# Patient Record
Sex: Female | Born: 1950 | Race: White | Hispanic: No | Marital: Married | Smoking: Never smoker
Health system: Southern US, Community
[De-identification: ages and names within clinical notes are randomized; demographics above are authoritative.]

## PROBLEM LIST (undated history)

## (undated) DIAGNOSIS — Z973 Presence of spectacles and contact lenses: Secondary | ICD-10-CM

## (undated) DIAGNOSIS — R51 Headache: Secondary | ICD-10-CM

## (undated) DIAGNOSIS — R112 Nausea with vomiting, unspecified: Secondary | ICD-10-CM

## (undated) DIAGNOSIS — Z9889 Other specified postprocedural states: Secondary | ICD-10-CM

## (undated) DIAGNOSIS — K5790 Diverticulosis of intestine, part unspecified, without perforation or abscess without bleeding: Secondary | ICD-10-CM

## (undated) DIAGNOSIS — M1711 Unilateral primary osteoarthritis, right knee: Secondary | ICD-10-CM

## (undated) DIAGNOSIS — M1990 Unspecified osteoarthritis, unspecified site: Secondary | ICD-10-CM

## (undated) DIAGNOSIS — K219 Gastro-esophageal reflux disease without esophagitis: Secondary | ICD-10-CM

## (undated) DIAGNOSIS — M1710 Unilateral primary osteoarthritis, unspecified knee: Secondary | ICD-10-CM

## (undated) DIAGNOSIS — R519 Headache, unspecified: Secondary | ICD-10-CM

## (undated) HISTORY — DX: Unspecified osteoarthritis, unspecified site: M19.90

## (undated) HISTORY — PX: COLONOSCOPY: SHX174

## (undated) HISTORY — PX: KNEE SURGERY: SHX244

## (undated) HISTORY — PX: TUBAL LIGATION: SHX77

---

## 1998-10-04 ENCOUNTER — Other Ambulatory Visit: Admission: RE | Admit: 1998-10-04 | Discharge: 1998-10-04 | Payer: Self-pay | Admitting: Obstetrics and Gynecology

## 2001-03-25 ENCOUNTER — Other Ambulatory Visit: Admission: RE | Admit: 2001-03-25 | Discharge: 2001-03-25 | Payer: Self-pay | Admitting: Obstetrics and Gynecology

## 2002-07-09 ENCOUNTER — Other Ambulatory Visit: Admission: RE | Admit: 2002-07-09 | Discharge: 2002-07-09 | Payer: Self-pay | Admitting: Obstetrics and Gynecology

## 2003-08-27 ENCOUNTER — Other Ambulatory Visit: Admission: RE | Admit: 2003-08-27 | Discharge: 2003-08-27 | Payer: Self-pay | Admitting: Obstetrics and Gynecology

## 2004-05-26 ENCOUNTER — Ambulatory Visit: Payer: Self-pay | Admitting: Internal Medicine

## 2004-07-01 ENCOUNTER — Ambulatory Visit: Payer: Self-pay | Admitting: Family Medicine

## 2004-08-30 ENCOUNTER — Ambulatory Visit (HOSPITAL_COMMUNITY): Admission: RE | Admit: 2004-08-30 | Discharge: 2004-08-30 | Payer: Self-pay | Admitting: Obstetrics and Gynecology

## 2004-08-30 ENCOUNTER — Other Ambulatory Visit: Admission: RE | Admit: 2004-08-30 | Discharge: 2004-08-30 | Payer: Self-pay | Admitting: Obstetrics and Gynecology

## 2006-01-05 ENCOUNTER — Other Ambulatory Visit: Admission: RE | Admit: 2006-01-05 | Discharge: 2006-01-05 | Payer: Self-pay | Admitting: Obstetrics and Gynecology

## 2008-02-26 ENCOUNTER — Encounter: Payer: Self-pay | Admitting: Obstetrics and Gynecology

## 2008-02-26 ENCOUNTER — Ambulatory Visit: Payer: Self-pay | Admitting: Obstetrics and Gynecology

## 2008-02-26 ENCOUNTER — Other Ambulatory Visit: Admission: RE | Admit: 2008-02-26 | Discharge: 2008-02-26 | Payer: Self-pay | Admitting: Obstetrics and Gynecology

## 2008-04-20 ENCOUNTER — Encounter: Payer: Self-pay | Admitting: Internal Medicine

## 2008-04-22 ENCOUNTER — Encounter: Payer: Self-pay | Admitting: Internal Medicine

## 2008-09-28 ENCOUNTER — Ambulatory Visit: Payer: Self-pay | Admitting: Obstetrics and Gynecology

## 2008-10-06 ENCOUNTER — Ambulatory Visit: Payer: Self-pay | Admitting: Obstetrics and Gynecology

## 2009-04-14 ENCOUNTER — Ambulatory Visit: Payer: Self-pay | Admitting: Obstetrics and Gynecology

## 2009-04-22 ENCOUNTER — Ambulatory Visit: Payer: Self-pay | Admitting: Obstetrics and Gynecology

## 2009-05-26 ENCOUNTER — Ambulatory Visit: Payer: Self-pay | Admitting: Obstetrics and Gynecology

## 2010-02-04 ENCOUNTER — Ambulatory Visit: Payer: Self-pay | Admitting: Gynecology

## 2010-02-22 ENCOUNTER — Other Ambulatory Visit: Admission: RE | Admit: 2010-02-22 | Discharge: 2010-02-22 | Payer: Self-pay | Admitting: Addiction Medicine

## 2010-02-22 ENCOUNTER — Ambulatory Visit: Payer: Self-pay | Admitting: Obstetrics and Gynecology

## 2011-02-27 ENCOUNTER — Encounter: Payer: Self-pay | Admitting: Obstetrics and Gynecology

## 2011-02-27 ENCOUNTER — Ambulatory Visit (INDEPENDENT_AMBULATORY_CARE_PROVIDER_SITE_OTHER): Payer: Managed Care, Other (non HMO) | Admitting: Obstetrics and Gynecology

## 2011-02-27 ENCOUNTER — Other Ambulatory Visit (HOSPITAL_COMMUNITY)
Admission: RE | Admit: 2011-02-27 | Discharge: 2011-02-27 | Disposition: A | Discharge status: Home / Self Care | Payer: Managed Care, Other (non HMO) | Source: Ambulatory Visit | Attending: Obstetrics and Gynecology | Admitting: Obstetrics and Gynecology

## 2011-02-27 VITALS — BP 124/80 | Ht 64.0 in | Wt 196.0 lb

## 2011-02-27 DIAGNOSIS — E782 Mixed hyperlipidemia: Secondary | ICD-10-CM

## 2011-02-27 DIAGNOSIS — Z01419 Encounter for gynecological examination (general) (routine) without abnormal findings: Secondary | ICD-10-CM

## 2011-02-27 DIAGNOSIS — Z8262 Family history of osteoporosis: Secondary | ICD-10-CM

## 2011-02-27 DIAGNOSIS — R82998 Other abnormal findings in urine: Secondary | ICD-10-CM

## 2011-02-27 MED ORDER — NITROFURANTOIN MONOHYD MACRO 100 MG PO CAPS: 100.0000 mg | ORAL_CAPSULE | Freq: Two times a day (BID) | ORAL | Status: AC

## 2011-02-27 NOTE — Progress Notes (Signed)
The patient came to see me today for an annual GYN exam. She is having urgency and frequency for one week. She is overdue for mammogram. She's never had a bone density and she has a family history. She is having no vaginal bleeding. She is having some hot flashes and vaginal dryness. It is not enough to take medication. She had an elevated triglyceride level last year.  Physical examination: HEENT within normal limits. Neck: Thyroid not large. No masses. Supraclavicular nodes: not enlarged. Breasts: Examined in both sitting midline position. No skin changes and no masses. Abdomen: Soft no guarding rebound or masses or hernia. Pelvic: External: Within normal limits. BUS: Within normal limits. Vaginal:within normal limits. Good estrogen effect. No evidence of cystocele rectocele or enterocele. Cervix: clean. Uterus: Normal size and shape. Adnexa: No masses. Rectovaginal exam: Confirmatory and negative. Extremities: Within normal limits. Urinalysis-abnormal  Assessment: 1. UTI  2. Mild menopausal symptoms  Plan: Urine culture done. Macrobid twice a day with food for 7 days. Bone density schedule. Patient to schedule mammogram. Patient to return fasting for her lab work.

## 2011-02-27 NOTE — Patient Instructions (Signed)
Return fasting for lab work.

## 2011-02-28 ENCOUNTER — Telehealth: Payer: Self-pay | Admitting: *Deleted

## 2011-02-28 DIAGNOSIS — N390 Urinary tract infection, site not specified: Secondary | ICD-10-CM

## 2011-02-28 NOTE — Telephone Encounter (Signed)
Patient informed needs toc urine culture after antibiotics.

## 2011-03-08 ENCOUNTER — Other Ambulatory Visit: Payer: Managed Care, Other (non HMO)

## 2011-05-01 ENCOUNTER — Ambulatory Visit (INDEPENDENT_AMBULATORY_CARE_PROVIDER_SITE_OTHER): Payer: Managed Care, Other (non HMO) | Admitting: Obstetrics and Gynecology

## 2011-05-01 DIAGNOSIS — N390 Urinary tract infection, site not specified: Secondary | ICD-10-CM

## 2011-05-01 DIAGNOSIS — R300 Dysuria: Secondary | ICD-10-CM

## 2011-05-01 DIAGNOSIS — R82998 Other abnormal findings in urine: Secondary | ICD-10-CM

## 2011-05-01 MED ORDER — NITROFURANTOIN MONOHYD MACRO 100 MG PO CAPS: 100.0000 mg | ORAL_CAPSULE | Freq: Two times a day (BID) | ORAL | Status: AC

## 2011-05-01 NOTE — Progress Notes (Signed)
Patient came in today with a five-day history of dysuria, urgency, and administering of urination. She is not having urinary incontinence. She does not have hematuria. Her urinalysis showed too numerous to count white blood cells and red blood cells. She has a history of allergy to Cipro.  Assessment: UTI  Plan: Macrobid twice a day with food for 7 days. Forced fluids. Urine recheck in one week.

## 2011-05-01 NOTE — Progress Notes (Signed)
Addended by: Landis Martins R on: 05/01/2011 11:05 AM   Modules accepted: Orders

## 2011-05-09 ENCOUNTER — Ambulatory Visit (INDEPENDENT_AMBULATORY_CARE_PROVIDER_SITE_OTHER): Payer: Managed Care, Other (non HMO) | Admitting: Obstetrics and Gynecology

## 2011-05-09 DIAGNOSIS — R3915 Urgency of urination: Secondary | ICD-10-CM

## 2011-05-09 DIAGNOSIS — R350 Frequency of micturition: Secondary | ICD-10-CM

## 2011-05-09 MED ORDER — SULFAMETHOXAZOLE-TRIMETHOPRIM 400-80 MG PO TABS: 1.0000 | ORAL_TABLET | Freq: Two times a day (BID) | ORAL | Status: AC

## 2011-05-09 NOTE — Progress Notes (Signed)
Addended by: Landis Martins R on: 05/09/2011 02:37 PM   Modules accepted: Orders

## 2011-05-09 NOTE — Progress Notes (Signed)
Patient came back to see me today for followup of urinary tract infection. Her culture grew out greater than 100,000 colonies pure. Her urgency and frequency are much better but she still has some of these symptoms. She thinks she's about 70% better. Her urinalysis still shows 4-6 white blood cells per high-power field. The hematuria is much improved.  Assessment: Persistent urinary tract infection  Plan: Culture and sensitivity done. Until we have the results I treated  her with Septra DS twice a day for 5 days. I thought about using Cipro. When she used the pill in the past she did get some pain near her ankles and I was concerned about tendinitis from the Cipro.

## 2011-05-11 LAB — URINE CULTURE: Colony Count: 2000

## 2011-05-12 ENCOUNTER — Telehealth: Payer: Self-pay | Admitting: *Deleted

## 2011-05-12 NOTE — Telephone Encounter (Signed)
Message copied by Libby Maw on Fri May 12, 2011  2:42 PM ------      Message from: Trellis Paganini      Created: Thu May 11, 2011  5:56 PM       Tell pt her culture did not show infection but since she is symptomatic feel she should continue septra that I gave her. Have her call back when she is done with the five days I gave her yesterday and report how she is doing.

## 2011-05-12 NOTE — Telephone Encounter (Signed)
Patient states that she still has the urgency so will continue antibiotics.  Says she thinks she may have bladder leakage, and with the urgency maybe that is why she thinks she has a UTI.  Is there something we can give her in the mean time while DG is out to help with the sensation she is having?

## 2011-05-12 NOTE — Telephone Encounter (Signed)
Lm for patient to call

## 2011-05-12 NOTE — Telephone Encounter (Signed)
Please call patient and have her try Uristat, and finish  antibiotic Rx.

## 2011-05-12 NOTE — Telephone Encounter (Signed)
Patient informed. 

## 2011-07-08 ENCOUNTER — Other Ambulatory Visit: Payer: Self-pay | Admitting: Obstetrics and Gynecology

## 2011-11-07 ENCOUNTER — Encounter: Payer: Self-pay | Admitting: Obstetrics and Gynecology

## 2012-02-28 ENCOUNTER — Ambulatory Visit (INDEPENDENT_AMBULATORY_CARE_PROVIDER_SITE_OTHER): Payer: Managed Care, Other (non HMO) | Admitting: Obstetrics and Gynecology

## 2012-02-28 ENCOUNTER — Encounter: Payer: Self-pay | Admitting: Obstetrics and Gynecology

## 2012-02-28 ENCOUNTER — Encounter: Payer: Managed Care, Other (non HMO) | Admitting: Obstetrics and Gynecology

## 2012-02-28 VITALS — BP 136/84 | Ht 64.0 in | Wt 190.0 lb

## 2012-02-28 DIAGNOSIS — Z01419 Encounter for gynecological examination (general) (routine) without abnormal findings: Secondary | ICD-10-CM

## 2012-02-28 DIAGNOSIS — M1990 Unspecified osteoarthritis, unspecified site: Secondary | ICD-10-CM | POA: Insufficient documentation

## 2012-02-28 LAB — LIPID PANEL
Cholesterol: 205 mg/dL — ABNORMAL HIGH (ref 0–200)
LDL Cholesterol: 107 mg/dL — ABNORMAL HIGH (ref 0–99)
Triglycerides: 90 mg/dL (ref <150)
VLDL: 18 mg/dL (ref 0–40)

## 2012-02-28 LAB — HEMOGLOBIN A1C: Mean Plasma Glucose: 117 mg/dL — ABNORMAL HIGH (ref <117)

## 2012-02-28 NOTE — Progress Notes (Signed)
Patient came to see me today for her annual GYN exam. She is doing well in menopause without hot flashes. She is up-to-date on mammograms. We asked her to do a bone density last year but so far she's not done it. She requested we do her labs but she had a CBC and EKG elsewhere. She is having no vaginal bleeding. She is having no pelvic pain. She has never had an abnormal Pap smear. Her last Pap smear was 2012.  Physical examination:Mary Daniel present. HEENT within normal limits. Neck: Thyroid not large. No masses. Supraclavicular nodes: not enlarged. Breasts: Examined in both sitting and lying  position. No skin changes and no masses. Abdomen: Soft no guarding rebound or masses or hernia. Pelvic: External: Within normal limits. BUS: Within normal limits. Vaginal:within normal limits. Good estrogen effect. No evidence of cystocele rectocele or enterocele. Cervix: clean. Uterus: Normal size and shape. Adnexa: No masses. Rectovaginal exam: Confirmatory and negative. Extremities: Within normal limits.  Assessment: Normal GYN exam  Plan: Continue yearly mammograms. Schedule  bone density. The new Pap smear guidelines were discussed with the patient. No pap done.

## 2012-02-28 NOTE — Patient Instructions (Signed)
Schedule bone density.    

## 2012-02-29 ENCOUNTER — Encounter: Payer: Self-pay | Admitting: Obstetrics and Gynecology

## 2012-02-29 LAB — URINALYSIS W MICROSCOPIC + REFLEX CULTURE
Casts: NONE SEEN
Glucose, UA: NEGATIVE mg/dL
Nitrite: NEGATIVE
Protein, ur: NEGATIVE mg/dL
Specific Gravity, Urine: 1.014 (ref 1.005–1.030)
Urobilinogen, UA: 0.2 mg/dL (ref 0.0–1.0)
pH: 6 (ref 5.0–8.0)

## 2012-03-04 ENCOUNTER — Other Ambulatory Visit: Payer: Self-pay | Admitting: Obstetrics and Gynecology

## 2012-03-04 DIAGNOSIS — N390 Urinary tract infection, site not specified: Secondary | ICD-10-CM

## 2012-03-04 MED ORDER — SULFAMETHOXAZOLE-TRIMETHOPRIM 800-160 MG PO TABS: 1.0000 | ORAL_TABLET | Freq: Two times a day (BID) | ORAL | Status: DC

## 2012-06-07 ENCOUNTER — Ambulatory Visit (INDEPENDENT_AMBULATORY_CARE_PROVIDER_SITE_OTHER): Payer: Managed Care, Other (non HMO) | Admitting: Gynecology

## 2012-06-07 ENCOUNTER — Encounter: Payer: Self-pay | Admitting: Gynecology

## 2012-06-07 ENCOUNTER — Other Ambulatory Visit: Payer: Self-pay | Admitting: Gynecology

## 2012-06-07 VITALS — Temp 97.0°F

## 2012-06-07 DIAGNOSIS — N3090 Cystitis, unspecified without hematuria: Secondary | ICD-10-CM

## 2012-06-07 DIAGNOSIS — M549 Dorsalgia, unspecified: Secondary | ICD-10-CM

## 2012-06-07 LAB — URINALYSIS W MICROSCOPIC + REFLEX CULTURE
Bacteria, UA: NONE SEEN
Casts: NONE SEEN
Hgb urine dipstick: NEGATIVE
Nitrite: POSITIVE — AB
Specific Gravity, Urine: >1.03 — ABNORMAL HIGH (ref 1.005–1.030)

## 2012-06-07 MED ORDER — SULFAMETHOXAZOLE-TRIMETHOPRIM 800-160 MG PO TABS: 1.0000 | ORAL_TABLET | Freq: Two times a day (BID) | ORAL | Status: DC

## 2012-06-07 MED ORDER — IBUPROFEN 800 MG PO TABS: 800.0000 mg | ORAL_TABLET | Freq: Three times a day (TID) | ORAL | Status: DC | PRN

## 2012-06-07 NOTE — Patient Instructions (Signed)
Take septra twice daily for 3 days.  Use motrin 800 mg every 8 hours for pain

## 2012-06-07 NOTE — Progress Notes (Signed)
Patient presents with 2 days of low back pain mild urgency symptoms. No frequency or dysuria. Has a history of UTIs in the past. Does feel little chills but no fevers. Took over-the-counter AZ0 standard yesterday.  Exam was chem assistant Afebrile Spine straight no CVA tenderness Abdomen soft nontender without masses guarding rebound organomegaly Pelvic bimanual without masses or tenderness  Assessment and plan: Symptoms suggestive of early UTI. Urinalysis does show positive nitrate and several WBCs per high-powered field. Will cover with Septra DS one by mouth twice a day x3 days. Also provided Motrin 800 mg #30 refill x1 for her low back pain which I'm not sure is related to her urinary system. She'll follow up if either of her symptoms persist. Otherwise follow up which is due for her annual exam.

## 2012-07-05 ENCOUNTER — Telehealth: Payer: Self-pay | Admitting: *Deleted

## 2012-07-05 MED ORDER — NITROFURANTOIN MONOHYD MACRO 100 MG PO CAPS: 100.0000 mg | ORAL_CAPSULE | Freq: Two times a day (BID) | ORAL | Status: DC

## 2012-07-05 NOTE — Telephone Encounter (Signed)
Macrobid 100 mg twice a day x7 days, office visit if symptoms persist

## 2012-07-05 NOTE — Telephone Encounter (Signed)
Pt was given Septra DS one by mouth twice a day x3 days for UTI on 06/07/12, she took rx and noticed that symptoms have returned in last couple of days. Pt is having back discomfort she uses the motrin for this, urgency. No chills,no burning,no fever or any other symptoms. Please advise

## 2012-07-05 NOTE — Telephone Encounter (Signed)
Pt informed with the below note. 

## 2012-07-17 ENCOUNTER — Encounter: Payer: Self-pay | Admitting: Gynecology

## 2012-07-17 ENCOUNTER — Ambulatory Visit (INDEPENDENT_AMBULATORY_CARE_PROVIDER_SITE_OTHER): Payer: Managed Care, Other (non HMO) | Admitting: Gynecology

## 2012-07-17 DIAGNOSIS — N390 Urinary tract infection, site not specified: Secondary | ICD-10-CM

## 2012-07-17 DIAGNOSIS — R3915 Urgency of urination: Secondary | ICD-10-CM

## 2012-07-17 LAB — URINALYSIS W MICROSCOPIC + REFLEX CULTURE
Bilirubin Urine: NEGATIVE
Casts: NONE SEEN
Crystals: NONE SEEN
Ketones, ur: NEGATIVE mg/dL
Specific Gravity, Urine: 1.025 (ref 1.005–1.030)
Urobilinogen, UA: 0.2 mg/dL (ref 0.0–1.0)
pH: 7 (ref 5.0–8.0)

## 2012-07-17 MED ORDER — CIPROFLOXACIN HCL 250 MG PO TABS: 250.0000 mg | ORAL_TABLET | Freq: Two times a day (BID) | ORAL | Status: DC

## 2012-07-17 NOTE — Progress Notes (Signed)
Patient presents having recently been treated for UTI with Septra DS twice a day x3 days 06/07/2012. Her symptoms got better but now have returned with suprapubic pressure and some low back pain.  Exam Spine straight no CVA tenderness Abdomen soft nontender without masses guarding rebound organomegaly.  Assessment and plan: UA and symptoms consistent with UTI. Will treat with ciprofloxacin 250 mg twice a day x7 days. Follow up if symptoms persist or recur.

## 2012-07-17 NOTE — Patient Instructions (Signed)
Take ciprofloxacin antibiotic twice daily for 7 days. Follow up if your symptoms persist or recur.

## 2012-07-20 ENCOUNTER — Other Ambulatory Visit: Payer: Self-pay

## 2012-07-20 LAB — URINE CULTURE: Colony Count: 8000

## 2012-07-23 ENCOUNTER — Other Ambulatory Visit: Payer: Self-pay | Admitting: Gynecology

## 2012-07-23 DIAGNOSIS — N390 Urinary tract infection, site not specified: Secondary | ICD-10-CM

## 2012-07-23 DIAGNOSIS — B3749 Other urogenital candidiasis: Secondary | ICD-10-CM

## 2012-07-23 DIAGNOSIS — R3129 Other microscopic hematuria: Secondary | ICD-10-CM

## 2012-07-26 ENCOUNTER — Ambulatory Visit: Payer: Managed Care, Other (non HMO)

## 2012-07-26 DIAGNOSIS — R3129 Other microscopic hematuria: Secondary | ICD-10-CM

## 2012-07-27 LAB — URINALYSIS W MICROSCOPIC + REFLEX CULTURE
Bacteria, UA: NONE SEEN
Casts: NONE SEEN
Crystals: NONE SEEN
Nitrite: NEGATIVE
Protein, ur: NEGATIVE mg/dL

## 2012-07-28 LAB — URINE CULTURE: Colony Count: NO GROWTH

## 2012-08-15 ENCOUNTER — Encounter: Payer: Self-pay | Admitting: Gynecology

## 2012-08-15 ENCOUNTER — Ambulatory Visit: Payer: Managed Care, Other (non HMO) | Admitting: Gynecology

## 2012-08-15 DIAGNOSIS — R350 Frequency of micturition: Secondary | ICD-10-CM

## 2012-08-15 DIAGNOSIS — N390 Urinary tract infection, site not specified: Secondary | ICD-10-CM

## 2012-08-15 LAB — URINALYSIS W MICROSCOPIC + REFLEX CULTURE
Crystals: NONE SEEN
Glucose, UA: NEGATIVE mg/dL
Protein, ur: NEGATIVE mg/dL
Urobilinogen, UA: 0.2 mg/dL (ref 0.0–1.0)
pH: 5.5 (ref 5.0–8.0)

## 2012-08-15 MED ORDER — NITROFURANTOIN MONOHYD MACRO 100 MG PO CAPS: 100.0000 mg | ORAL_CAPSULE | Freq: Two times a day (BID) | ORAL | Status: DC

## 2012-08-15 NOTE — Patient Instructions (Signed)
Take antibiotic twice daily for 7 days. Office will help to arrange an appointment with a urologist.

## 2012-08-15 NOTE — Progress Notes (Signed)
Patient presents with one to 2 day history of pelvic pressure and some mild low back pain. Also some frequent urination. Has been recently treated for UTI x2 in January and February. Notes complete resolution of her symptoms after treatment. Historically she does have history of frequent UTIs that do not appear related to intercourse or other activities.  Exam with Selena Batten assistant Spine straight without CVA tenderness. Abdomen soft nontender without masses guarding rebound or organomegaly. Pelvic external BUS vagina with atrophic changes. Bimanual without masses or tenderness.  Assessment and plan: UA and symptoms suspicious for early UTI. Will cover with Macrobid 100 mg twice a day x7 days. Recommend following up with urology just to make sure there is nothing else going on to account for frequent UTIs and she agrees with this and we'll go ahead and arrange this.

## 2012-08-16 ENCOUNTER — Telehealth: Payer: Self-pay | Admitting: *Deleted

## 2012-08-16 NOTE — Telephone Encounter (Signed)
Message copied by Aura Camps on Fri Aug 16, 2012  8:45 AM ------      Message from: Colin Broach P      Created: Thu Aug 15, 2012  4:20 PM       Set up appointment with urology in reference to frequent UTIs. ------

## 2012-08-16 NOTE — Telephone Encounter (Signed)
Left message for pt to call, appt on 08/27/12 with Dr, Lissa Hoard at 1:00 pm

## 2012-08-23 NOTE — Telephone Encounter (Signed)
Pt wasn't at home, her husband Rosanne Ashing will inform her with time and date of the below appointment.

## 2013-02-26 ENCOUNTER — Ambulatory Visit (INDEPENDENT_AMBULATORY_CARE_PROVIDER_SITE_OTHER): Payer: Managed Care, Other (non HMO) | Admitting: Women's Health

## 2013-02-26 ENCOUNTER — Encounter: Payer: Self-pay | Admitting: Women's Health

## 2013-02-26 ENCOUNTER — Ambulatory Visit: Payer: Managed Care, Other (non HMO) | Admitting: Gynecology

## 2013-02-26 DIAGNOSIS — Z87440 Personal history of urinary (tract) infections: Secondary | ICD-10-CM

## 2013-02-26 DIAGNOSIS — IMO0001 Reserved for inherently not codable concepts without codable children: Secondary | ICD-10-CM

## 2013-02-26 DIAGNOSIS — N390 Urinary tract infection, site not specified: Secondary | ICD-10-CM

## 2013-02-26 DIAGNOSIS — R350 Frequency of micturition: Secondary | ICD-10-CM

## 2013-02-26 LAB — URINALYSIS W MICROSCOPIC + REFLEX CULTURE
Bilirubin Urine: NEGATIVE
Casts: NONE SEEN
Ketones, ur: NEGATIVE mg/dL
Specific Gravity, Urine: 1.02 (ref 1.005–1.030)
pH: 5.5 (ref 5.0–8.0)

## 2013-02-26 MED ORDER — CIPROFLOXACIN HCL 250 MG PO TABS: 250.0000 mg | ORAL_TABLET | Freq: Two times a day (BID) | ORAL | Status: DC

## 2013-02-26 NOTE — Progress Notes (Signed)
Patient ID: Mary Daniel, female   DOB: 1951-02-15, 62 y.o.   MRN: [id number] Presents with one week of urinary urgency. Denies nocturia, fever, dysuria, hematuria, vaginal symptoms. Went to see urologist last week and was prescribed Macrobid, had minimal relief after treatment and symptoms persisted. Has 6-7 urinary tract infections per year which call present with urinary frequency and no other symptoms. Urologist also prescribed a topical estrogen cream to help with recurrence of UTI. Postmenopausal/no bleeding.  Urinalysis: TNTC white blood cells, 3-6 red blood cells, few bacteria. Culture pending.  Urinary tract infection, resistant to previous treatment  Plan: Ciprofloxacin 250 mg twice a day x5 days, UTI prevention discussed, encourage use of estrogen cream externally 2-3 times a week, advised to call office if symptoms worsen or do not improve after treatment. Culture pending.

## 2013-02-26 NOTE — Patient Instructions (Signed)
Urinary Tract Infection  Urinary tract infections (UTIs) can develop anywhere along your urinary tract. Your urinary tract is your body's drainage system for removing wastes and extra water. Your urinary tract includes two kidneys, two ureters, a bladder, and a urethra. Your kidneys are a pair of bean-shaped organs. Each kidney is about the size of your fist. They are located below your ribs, one on each side of your spine.  CAUSES  Infections are caused by microbes, which are microscopic organisms, including fungi, viruses, and bacteria. These organisms are so small that they can only be seen through a microscope. Bacteria are the microbes that most commonly cause UTIs.  SYMPTOMS   Symptoms of UTIs may vary by age and gender of the patient and by the location of the infection. Symptoms in young women typically include a frequent and intense urge to urinate and a painful, burning feeling in the bladder or urethra during urination. Older women and men are more likely to be tired, shaky, and weak and have muscle aches and abdominal pain. A fever may mean the infection is in your kidneys. Other symptoms of a kidney infection include pain in your back or sides below the ribs, nausea, and vomiting.  DIAGNOSIS  To diagnose a UTI, your caregiver will ask you about your symptoms. Your caregiver also will ask to provide a urine sample. The urine sample will be tested for bacteria and white blood cells. White blood cells are made by your body to help fight infection.  TREATMENT   Typically, UTIs can be treated with medication. Because most UTIs are caused by a bacterial infection, they usually can be treated with the use of antibiotics. The choice of antibiotic and length of treatment depend on your symptoms and the type of bacteria causing your infection.  HOME CARE INSTRUCTIONS   If you were prescribed antibiotics, take them exactly as your caregiver instructs you. Finish the medication even if you feel better after you  have only taken some of the medication.   Drink enough water and fluids to keep your urine clear or pale yellow.   Avoid caffeine, tea, and carbonated beverages. They tend to irritate your bladder.   Empty your bladder often. Avoid holding urine for long periods of time.   Empty your bladder before and after sexual intercourse.   After a bowel movement, women should cleanse from front to back. Use each tissue only once.  SEEK MEDICAL CARE IF:    You have back pain.   You develop a fever.   Your symptoms do not begin to resolve within 3 days.  SEEK IMMEDIATE MEDICAL CARE IF:    You have severe back pain or lower abdominal pain.   You develop chills.   You have nausea or vomiting.   You have continued burning or discomfort with urination.  MAKE SURE YOU:    Understand these instructions.   Will watch your condition.   Will get help right away if you are not doing well or get worse.  Document Released: 03/01/2005 Document Revised: 11/21/2011 Document Reviewed: 06/30/2011  [employer] Patient Information 2014 [employer].

## 2013-02-27 ENCOUNTER — Encounter: Payer: Self-pay | Admitting: Obstetrics and Gynecology

## 2013-02-28 ENCOUNTER — Telehealth: Payer: Self-pay | Admitting: *Deleted

## 2013-02-28 NOTE — Telephone Encounter (Signed)
Telephone call, states is feeling better will finish out Rx and call if  symptoms not completely resolved.

## 2013-02-28 NOTE — Telephone Encounter (Signed)
Pt said you called her this am and told her to call if she wasn't feeling better. Pt is having some back discomfort, no burning with urination, not has much urgency. Mainly back discomfort, pt was treated at [facility] 02/26/13 with cirpo 250 mg x5 days. Please advise

## 2013-03-01 LAB — URINE CULTURE: Colony Count: >=100000

## 2013-03-07 ENCOUNTER — Encounter: Payer: Self-pay | Admitting: Obstetrics and Gynecology

## 2013-04-08 ENCOUNTER — Encounter: Payer: Managed Care, Other (non HMO) | Admitting: Gynecology

## 2013-04-10 ENCOUNTER — Other Ambulatory Visit: Payer: Self-pay

## 2013-05-15 ENCOUNTER — Ambulatory Visit (INDEPENDENT_AMBULATORY_CARE_PROVIDER_SITE_OTHER): Payer: Managed Care, Other (non HMO) | Admitting: Women's Health

## 2013-05-15 ENCOUNTER — Encounter: Payer: Self-pay | Admitting: Women's Health

## 2013-05-15 DIAGNOSIS — N390 Urinary tract infection, site not specified: Secondary | ICD-10-CM

## 2013-05-15 DIAGNOSIS — R350 Frequency of micturition: Secondary | ICD-10-CM

## 2013-05-15 LAB — URINALYSIS W MICROSCOPIC + REFLEX CULTURE
Bilirubin Urine: NEGATIVE
Casts: NONE SEEN
Nitrite: NEGATIVE
Specific Gravity, Urine: >1.03 — ABNORMAL HIGH (ref 1.005–1.030)

## 2013-05-15 MED ORDER — CIPROFLOXACIN HCL 250 MG PO TABS
250.0000 mg | ORAL_TABLET | Freq: Two times a day (BID) | ORAL | Status: DC
Start: 2013-05-15 — End: 2013-10-31

## 2013-05-15 MED ORDER — CIPROFLOXACIN HCL 250 MG PO TABS: 250.0000 mg | ORAL_TABLET | Freq: Two times a day (BID) | ORAL | Status: DC

## 2013-05-15 NOTE — Progress Notes (Signed)
Patient ID: Mary Daniel, female   DOB: Apr 04, 1951, 62 y.o.   MRN: [id number] Presents with complaint of increased urinary frequency with urgency and slight back discomfort for several days. Denies vaginal discharge. Denies abdominal pain or fever.  Exam: Appears well. UA small leukocytes TNTC WBCs, few bacteria.  Recurrent UTI  Plan: Cipro 250 twice daily for 5 days prescription, proper use given and reviewed. Instructed to call if no relief of symptoms. Aware of UTI prevention, declined AVS. Yesenia Fontenette culture pending.

## 2013-05-18 LAB — URINE CULTURE: Colony Count: >=100000

## 2013-05-21 ENCOUNTER — Ambulatory Visit: Payer: Managed Care, Other (non HMO) | Admitting: Women's Health

## 2013-05-21 ENCOUNTER — Telehealth: Payer: Self-pay | Admitting: *Deleted

## 2013-05-21 ENCOUNTER — Other Ambulatory Visit: Payer: Managed Care, Other (non HMO)

## 2013-05-21 DIAGNOSIS — N390 Urinary tract infection, site not specified: Secondary | ICD-10-CM

## 2013-05-21 NOTE — Telephone Encounter (Signed)
Pt said she made appointment when office opened, then called me. She would like to have recheck u/a of possible without OV.

## 2013-05-21 NOTE — Telephone Encounter (Signed)
Ok have her recheck CC UA with culture.  She did have a UTI treated at last OV.  Ask if fever, DC or abd pain if any OV best.

## 2013-05-21 NOTE — Telephone Encounter (Signed)
Pt was treated for UTI on 05/15/13 noticed on Monday she felt as symtoms were returning with mild urgency. Pt said for last 2 days c/o having some lower back discomfort, not much urgency. She asked if repeat u/a should be done? Please advise

## 2013-05-21 NOTE — Telephone Encounter (Signed)
Pt informed with the below note, order placed. 

## 2013-05-21 NOTE — Telephone Encounter (Signed)
She is on my schedule for 12 today, we will recheck UA

## 2013-05-22 LAB — URINALYSIS W MICROSCOPIC + REFLEX CULTURE
Bacteria, UA: NONE SEEN
Casts: NONE SEEN
Glucose, UA: NEGATIVE mg/dL
Hgb urine dipstick: NEGATIVE
Leukocytes, UA: NEGATIVE
Nitrite: NEGATIVE
Protein, ur: NEGATIVE mg/dL
Urobilinogen, UA: 0.2 mg/dL (ref 0.0–1.0)
pH: 5.5 (ref 5.0–8.0)

## 2013-10-07 ENCOUNTER — Other Ambulatory Visit: Payer: Self-pay | Admitting: Gynecology

## 2013-10-31 ENCOUNTER — Telehealth: Payer: Self-pay | Admitting: *Deleted

## 2013-10-31 DIAGNOSIS — N390 Urinary tract infection, site not specified: Secondary | ICD-10-CM

## 2013-10-31 MED ORDER — CIPROFLOXACIN HCL 250 MG PO TABS: 250.0000 mg | ORAL_TABLET | Freq: Two times a day (BID) | ORAL | Status: DC

## 2013-10-31 NOTE — Telephone Encounter (Signed)
Pt on road now out [place] beach c/o UTI urgency, back discomfort, no burnin, no fever. Pt asked if Rx could be sent? Please advise

## 2013-10-31 NOTE — Telephone Encounter (Signed)
History of recurrent UTIs, Cipro 250 twice daily for 3 days, office visit if no relief of symptoms.

## 2013-10-31 NOTE — Telephone Encounter (Signed)
Pt informed, rx sent 

## 2014-01-30 ENCOUNTER — Ambulatory Visit (INDEPENDENT_AMBULATORY_CARE_PROVIDER_SITE_OTHER): Payer: Medicare Other | Admitting: Women's Health

## 2014-01-30 ENCOUNTER — Encounter: Payer: Self-pay | Admitting: Women's Health

## 2014-01-30 DIAGNOSIS — R3915 Urgency of urination: Secondary | ICD-10-CM | POA: Diagnosis not present

## 2014-01-30 DIAGNOSIS — N3000 Acute cystitis without hematuria: Secondary | ICD-10-CM

## 2014-01-30 LAB — URINALYSIS W MICROSCOPIC + REFLEX CULTURE
BILIRUBIN URINE: NEGATIVE
Casts: NONE SEEN
Crystals: NONE SEEN
Glucose, UA: NEGATIVE mg/dL
KETONES UR: NEGATIVE mg/dL
NITRITE: NEGATIVE
PROTEIN: NEGATIVE mg/dL
Specific Gravity, Urine: 1.015 (ref 1.005–1.030)
UROBILINOGEN UA: 0.2 mg/dL (ref 0.0–1.0)
pH: 5.5 (ref 5.0–8.0)

## 2014-01-30 MED ORDER — CIPROFLOXACIN HCL 250 MG PO TABS
250.0000 mg | ORAL_TABLET | Freq: Two times a day (BID) | ORAL | Status: DC
Start: 2014-01-30 — End: 2014-05-22

## 2014-01-30 NOTE — Progress Notes (Signed)
Patient ID: Mary Daniel, female   DOB: 06-Jun-1950, 63 y.o.   MRN: [id number] Presents with complaint of low back discomfort, urinary frequency, urgency with pressure and feeling of incomplete emptying of bladder. Denies pain at end of stream. Denies vaginal discharge, abdominal pain or fever. History of recurrent UTIs, last UTI 05/2013.  Exam: Appears well. No CVAT. UA: Moderate leukocytes, TNTC WBCs, few bacteria.  UTI  Plan: Cipro 250 twice daily for 3 days #6 prescription, proper use given and reviewed. Instructed to call if no relief of symptoms. UTI prevention discussed. Urine culture pending.

## 2014-01-30 NOTE — Patient Instructions (Signed)

## 2014-02-01 LAB — URINE CULTURE

## 2014-03-06 ENCOUNTER — Telehealth: Payer: Self-pay | Admitting: *Deleted

## 2014-03-06 MED ORDER — SULFAMETHOXAZOLE-TMP DS 800-160 MG PO TABS: 1.0000 | ORAL_TABLET | Freq: Two times a day (BID) | ORAL | Status: DC

## 2014-03-06 NOTE — Telephone Encounter (Signed)
(  you are back up MD) Pt called c/o UTI frequent urination, pressure, pt in the process of moving lives in [place] unable to come in for visit. Asked if you would be willing to send Rx. Please advise

## 2014-03-06 NOTE — Telephone Encounter (Signed)
Septra DS 1 by mouth twice a day x3 days. I would also recommend a urology referral as in review of her chart she has had multiple UTIs over the past year or 2 and I think she should see a urologist for evaluation

## 2014-03-06 NOTE — Telephone Encounter (Signed)
Pt informed with the below note, rx sent, pt has urologist Dr.Dalstedt will follow up with him

## 2014-03-26 DIAGNOSIS — N3000 Acute cystitis without hematuria: Secondary | ICD-10-CM | POA: Diagnosis not present

## 2014-04-06 ENCOUNTER — Encounter: Payer: Self-pay | Admitting: Women's Health

## 2014-05-22 ENCOUNTER — Ambulatory Visit (INDEPENDENT_AMBULATORY_CARE_PROVIDER_SITE_OTHER): Payer: Medicare Other | Admitting: Women's Health

## 2014-05-22 ENCOUNTER — Encounter: Payer: Self-pay | Admitting: Women's Health

## 2014-05-22 VITALS — BP 118/80 | Ht 65.0 in | Wt 191.0 lb

## 2014-05-22 DIAGNOSIS — N3000 Acute cystitis without hematuria: Secondary | ICD-10-CM

## 2014-05-22 DIAGNOSIS — R350 Frequency of micturition: Secondary | ICD-10-CM

## 2014-05-22 LAB — URINALYSIS W MICROSCOPIC + REFLEX CULTURE
BILIRUBIN URINE: NEGATIVE
CASTS: NONE SEEN
CRYSTALS: NONE SEEN
GLUCOSE, UA: NEGATIVE mg/dL
Hgb urine dipstick: NEGATIVE
Ketones, ur: NEGATIVE mg/dL
Nitrite: NEGATIVE
PH: 5.5 (ref 5.0–8.0)
PROTEIN: NEGATIVE mg/dL
SPECIFIC GRAVITY, URINE: 1.015 (ref 1.005–1.030)
Urobilinogen, UA: 0.2 mg/dL (ref 0.0–1.0)

## 2014-05-22 MED ORDER — CIPROFLOXACIN HCL 250 MG PO TABS: 250.0000 mg | ORAL_TABLET | Freq: Two times a day (BID) | ORAL | Status: DC

## 2014-05-22 NOTE — Patient Instructions (Signed)

## 2014-05-22 NOTE — Progress Notes (Signed)
Patient ID: Mary Daniel, female   DOB: Apr 01, 1951, 63 y.o.   MRN: [id number] Presents with complaint of urinary frequency with urgency slight discomfort with urination. Low back pain. States is under increased stress from recent move. Has had problems with recurrent UTIs has had seen Dr. Diona Fanti for a consult who recommended estrogen vaginal cream twice weekly may help, states was fearful for side effects, cost and did not use.  Exam: Appears well. No CVAT. UA trace leukocytes, 7-10 WBCs, rare bacteria.  Probable UTI  Plan: Options to wait for urine culture results, requested treatment, Cipro 250 twice daily for 3 days. Urine culture pending. UTI prevention discussed. Instructed to start over-the-counter cranberry supplements daily to prevent recurrence. Is aware of UTI prevention.

## 2014-05-24 LAB — URINE CULTURE
Colony Count: NO GROWTH
Organism ID, Bacteria: NO GROWTH

## 2014-07-21 ENCOUNTER — Encounter: Payer: Self-pay | Admitting: Women's Health

## 2014-07-21 ENCOUNTER — Ambulatory Visit (INDEPENDENT_AMBULATORY_CARE_PROVIDER_SITE_OTHER): Payer: Medicare Other | Admitting: Women's Health

## 2014-07-21 DIAGNOSIS — B3731 Acute candidiasis of vulva and vagina: Secondary | ICD-10-CM

## 2014-07-21 DIAGNOSIS — B373 Candidiasis of vulva and vagina: Secondary | ICD-10-CM | POA: Diagnosis not present

## 2014-07-21 DIAGNOSIS — R8290 Unspecified abnormal findings in urine: Secondary | ICD-10-CM | POA: Diagnosis not present

## 2014-07-21 DIAGNOSIS — R8299 Other abnormal findings in urine: Secondary | ICD-10-CM | POA: Diagnosis not present

## 2014-07-21 LAB — URINALYSIS W MICROSCOPIC + REFLEX CULTURE
Bilirubin Urine: NEGATIVE
Casts: NONE SEEN
Crystals: NONE SEEN
Glucose, UA: NEGATIVE mg/dL
Hgb urine dipstick: NEGATIVE
Ketones, ur: NEGATIVE mg/dL
Nitrite: POSITIVE — AB
Protein, ur: NEGATIVE mg/dL
Specific Gravity, Urine: >1.03 — ABNORMAL HIGH (ref 1.005–1.030)
Urobilinogen, UA: 0.2 mg/dL (ref 0.0–1.0)
pH: 5 (ref 5.0–8.0)

## 2014-07-21 LAB — WET PREP FOR TRICH, YEAST, CLUE
Clue Cells Wet Prep HPF POC: NONE SEEN
Trich, Wet Prep: NONE SEEN

## 2014-07-21 MED ORDER — FLUCONAZOLE 150 MG PO TABS: 150.0000 mg | ORAL_TABLET | Freq: Once | ORAL | Status: DC

## 2014-07-21 NOTE — Progress Notes (Signed)
Patient ID: Mary Daniel, female   DOB: Mar 30, 1951, 64 y.o.   MRN: [id number] Presents with complaint of vaginal irritation, urinary pressure, odor with urine, and mild back pain. Took  Azo which helped relieve symptoms. Denies fever, pain or burning with urination. History of recurrent UTIs  Exam: Appears well. External genitalia within normal limits, atrophic, speculum exam scant discharge wet prep positive for yeast. UA positive nitrites, small leukocytes, 11-20 WBCs, many bacteria, many squamous epithelials.  Yeast vaginitis Rule out UTI  Plan: Urine culture pending. Continue Azo. Options for vaginal atrophy reviewed will try Replens. Diflucan 150 by mouth 1 dose prescription, proper use given and reviewed. Instructed to call if symptoms persist.

## 2014-07-21 NOTE — Addendum Note (Signed)
Addended by: Nelva Nay on: 07/21/2014 02:29 PM   Modules accepted: Orders

## 2014-07-21 NOTE — Patient Instructions (Signed)

## 2014-07-23 LAB — URINE CULTURE

## 2014-07-31 ENCOUNTER — Telehealth: Payer: Self-pay | Admitting: *Deleted

## 2014-07-31 DIAGNOSIS — N390 Urinary tract infection, site not specified: Secondary | ICD-10-CM

## 2014-07-31 MED ORDER — CIPROFLOXACIN HCL 250 MG PO TABS: 250.0000 mg | ORAL_TABLET | Freq: Two times a day (BID) | ORAL | Status: DC

## 2014-07-31 NOTE — Telephone Encounter (Signed)
Pt informed, rx sent, order removed

## 2014-07-31 NOTE — Telephone Encounter (Signed)
Please call, will treat with Cipro 250 twice daily for 3 days #6. Office visit if no relief after treating. Does not need to leave another urine , will treat since symptoms have persisted.

## 2014-07-31 NOTE — Telephone Encounter (Signed)
Pt was seen on 07/21/14 for vaginal irritation, urinary pressure, odor with urine, and mild back pain. (see urine culture result on 07/21/14) pt will come today to leave U/A c/o urgency, slight lower back discomfort, no fever or chills.

## 2014-10-20 DIAGNOSIS — L309 Dermatitis, unspecified: Secondary | ICD-10-CM | POA: Diagnosis not present

## 2014-10-20 DIAGNOSIS — R21 Rash and other nonspecific skin eruption: Secondary | ICD-10-CM | POA: Diagnosis not present

## 2014-10-23 DIAGNOSIS — R21 Rash and other nonspecific skin eruption: Secondary | ICD-10-CM | POA: Diagnosis not present

## 2014-10-26 DIAGNOSIS — L309 Dermatitis, unspecified: Secondary | ICD-10-CM | POA: Diagnosis not present

## 2014-11-23 ENCOUNTER — Encounter: Payer: Self-pay | Admitting: Gynecology

## 2014-11-23 ENCOUNTER — Ambulatory Visit (INDEPENDENT_AMBULATORY_CARE_PROVIDER_SITE_OTHER): Payer: Medicare Other | Admitting: Gynecology

## 2014-11-23 VITALS — BP 124/78

## 2014-11-23 DIAGNOSIS — N3000 Acute cystitis without hematuria: Secondary | ICD-10-CM

## 2014-11-23 LAB — URINALYSIS W MICROSCOPIC + REFLEX CULTURE
Bilirubin Urine: NEGATIVE
Casts: NONE SEEN
Crystals: NONE SEEN
Glucose, UA: NEGATIVE mg/dL
HGB URINE DIPSTICK: NEGATIVE
Ketones, ur: NEGATIVE mg/dL
NITRITE: NEGATIVE
Protein, ur: NEGATIVE mg/dL
RBC / HPF: NONE SEEN RBC/hpf (ref <3)
Specific Gravity, Urine: 1.02 (ref 1.005–1.030)
UROBILINOGEN UA: 0.2 mg/dL (ref 0.0–1.0)
pH: 5 (ref 5.0–8.0)

## 2014-11-23 MED ORDER — SULFAMETHOXAZOLE-TRIMETHOPRIM 800-160 MG PO TABS: 1.0000 | ORAL_TABLET | Freq: Two times a day (BID) | ORAL | Status: DC

## 2014-11-23 NOTE — Addendum Note (Signed)
Addended by: Nelva Nay on: 11/23/2014 11:15 AM   Modules accepted: Orders

## 2014-11-23 NOTE — Patient Instructions (Signed)
Take the antibiotic twice daily for 3 days.  Follow-up if your symptoms persist, worsen or recur. 

## 2014-11-23 NOTE — Progress Notes (Signed)
Mary Daniel Oct 22, 1950 [id number]        63 y.o.  [id number] Presents with a one-week history of mild bladder pressure. No significant frequency, dysuria or urgency. No low back pain, fevers or chills.  Past medical history,surgical history, problem list, medications, allergies, family history and social history were all reviewed and documented in the EPIC chart.  Directed ROS with pertinent positives and negatives documented in the history of present illness/assessment and plan.  Exam: Filed Vitals:   11/23/14 0850  BP: 124/78   General appearance:  Normal Spine straight without CVA tenderness Abdomen soft nontender without masses guarding rebound  Assessment/Plan:  64 y.o. [id number] with symptoms and urinalysis to suggest early mild UTI. Will cover with Septra DS 1 by mouth twice a day 3 days. Follow up if symptoms persist, worsen or recur.    Anastasio Auerbach MD, 9:54 AM 11/23/2014

## 2014-11-26 LAB — URINE CULTURE

## 2014-12-29 DIAGNOSIS — N390 Urinary tract infection, site not specified: Secondary | ICD-10-CM | POA: Diagnosis not present

## 2015-01-14 ENCOUNTER — Encounter: Payer: Medicare Other | Admitting: Gynecology

## 2015-03-18 ENCOUNTER — Encounter: Payer: Medicare Other | Admitting: Gynecology

## 2015-03-25 ENCOUNTER — Encounter: Payer: Self-pay | Admitting: Women's Health

## 2015-03-25 ENCOUNTER — Ambulatory Visit (INDEPENDENT_AMBULATORY_CARE_PROVIDER_SITE_OTHER): Payer: Medicare Other | Admitting: Women's Health

## 2015-03-25 VITALS — BP 132/80 | Ht 65.0 in | Wt 191.0 lb

## 2015-03-25 DIAGNOSIS — Z23 Encounter for immunization: Secondary | ICD-10-CM | POA: Diagnosis not present

## 2015-03-25 DIAGNOSIS — R350 Frequency of micturition: Secondary | ICD-10-CM

## 2015-03-25 DIAGNOSIS — N3000 Acute cystitis without hematuria: Secondary | ICD-10-CM | POA: Diagnosis not present

## 2015-03-25 LAB — URINALYSIS W MICROSCOPIC + REFLEX CULTURE
Bilirubin Urine: NEGATIVE
CASTS: NONE SEEN [LPF]
Crystals: NONE SEEN [HPF]
Glucose, UA: NEGATIVE
KETONES UR: NEGATIVE
Nitrite: NEGATIVE
Specific Gravity, Urine: >1.03 (ref 1.001–1.035)
YEAST: NONE SEEN [HPF]
pH: 5.5 (ref 5.0–8.0)

## 2015-03-25 MED ORDER — CIPROFLOXACIN HCL 250 MG PO TABS: 250.0000 mg | ORAL_TABLET | Freq: Two times a day (BID) | ORAL | Status: DC

## 2015-03-25 NOTE — Progress Notes (Signed)
Patient ID: Mary Daniel, female   DOB: 1950/10/10, 64 y.o.   MRN: [id number] Presents with complaint of increased urinary frequency, urgency and odor with urine, denies pain or burning with urination. Frequency has been intermittent during the last 3-4 weeks, reports does not feel well. Denies abdominal pain, vaginal discharge or fever. History of recurrent UTIs. Postmenopausal on no HRT  Exam: Appears well. UA: Trace blood, 2+ leukocytes, 20-40 WBCs, 3-10 RBCs microscopic done on concentrated urine,  no CVAT  UTI  Plan: Urine culture pending. Cipro 250 by mouth  twice a day for 3 days #6. Instructed to call if no relief of symptoms. UTI prevention discussed. Increase fluid intake.

## 2015-03-25 NOTE — Patient Instructions (Signed)

## 2015-03-28 LAB — URINE CULTURE

## 2015-04-06 DIAGNOSIS — Z23 Encounter for immunization: Secondary | ICD-10-CM | POA: Diagnosis not present

## 2015-04-06 DIAGNOSIS — Z136 Encounter for screening for cardiovascular disorders: Secondary | ICD-10-CM | POA: Diagnosis not present

## 2015-04-06 DIAGNOSIS — Z6833 Body mass index (BMI) 33.0-33.9, adult: Secondary | ICD-10-CM | POA: Diagnosis not present

## 2015-04-06 DIAGNOSIS — E669 Obesity, unspecified: Secondary | ICD-10-CM | POA: Diagnosis not present

## 2015-04-06 DIAGNOSIS — Z0000 Encounter for general adult medical examination without abnormal findings: Secondary | ICD-10-CM | POA: Diagnosis not present

## 2015-04-06 DIAGNOSIS — Z1389 Encounter for screening for other disorder: Secondary | ICD-10-CM | POA: Diagnosis not present

## 2015-05-18 ENCOUNTER — Ambulatory Visit: Payer: Medicare Other | Admitting: Gynecology

## 2015-05-19 ENCOUNTER — Encounter: Payer: Self-pay | Admitting: Women's Health

## 2015-05-19 ENCOUNTER — Ambulatory Visit: Payer: Medicare Other | Admitting: Women's Health

## 2015-05-19 ENCOUNTER — Ambulatory Visit (INDEPENDENT_AMBULATORY_CARE_PROVIDER_SITE_OTHER): Payer: Medicare Other | Admitting: Women's Health

## 2015-05-19 VITALS — BP 128/80 | Ht 65.0 in | Wt 191.0 lb

## 2015-05-19 DIAGNOSIS — R350 Frequency of micturition: Secondary | ICD-10-CM | POA: Diagnosis not present

## 2015-05-19 LAB — URINALYSIS W MICROSCOPIC + REFLEX CULTURE
Bilirubin Urine: NEGATIVE
Casts: NONE SEEN [LPF]
Crystals: NONE SEEN [HPF]
Glucose, UA: NEGATIVE
Hgb urine dipstick: NEGATIVE
Ketones, ur: NEGATIVE
NITRITE: NEGATIVE
Protein, ur: NEGATIVE
RBC / HPF: NONE SEEN RBC/HPF (ref <=2)
SPECIFIC GRAVITY, URINE: 1.01 (ref 1.001–1.035)
YEAST: NONE SEEN [HPF]
pH: 5.5 (ref 5.0–8.0)

## 2015-05-19 NOTE — Progress Notes (Signed)
Patient ID: Mary Daniel, female   DOB: [birth year], 64 y.o.   MRN: [id number] Presents to have a UA. Having slight urinary frequency, urgency, minimal burning. Has had problems with recurrent UTIs, has seen Dr. Diona Fanti in the past.. 2/16 UTI with Escherichia coli treated, 11/2014 UTI treated - Klebsiella, 10/20 UTI treated- Klebsiella. States symptoms are not severe but wanted to be sure no UTI. Denies abdominal pain, fever or vaginal discharge. States increased stress, parents are in their 44s live independently and she helps with meals most days of the week.  Exam: Appears well. No CVAT. UA: +1 leukocytes, 20-40 WBCs, few bacteria, 6-10 squamous epithelials,  History of recurrent UTIs  Plan: Urine culture pending. Will treat depending on results. Continue to increase fluids, aware of UTI prevention.

## 2015-05-20 ENCOUNTER — Telehealth: Payer: Self-pay

## 2015-05-20 NOTE — Telephone Encounter (Signed)
Patient called to check on urine culture results. Informed they are still pending and will probably be tomorrow before final.

## 2015-05-21 ENCOUNTER — Other Ambulatory Visit: Payer: Self-pay | Admitting: Women's Health

## 2015-05-21 DIAGNOSIS — N3000 Acute cystitis without hematuria: Secondary | ICD-10-CM

## 2015-05-21 MED ORDER — CIPROFLOXACIN HCL 250 MG PO TABS: 250.0000 mg | ORAL_TABLET | Freq: Two times a day (BID) | ORAL | Status: DC

## 2015-05-22 LAB — URINE CULTURE

## 2015-07-14 ENCOUNTER — Encounter: Payer: Medicare Other | Admitting: Women's Health

## 2015-10-04 DIAGNOSIS — M7752 Other enthesopathy of left foot: Secondary | ICD-10-CM | POA: Diagnosis not present

## 2015-10-04 DIAGNOSIS — M170 Bilateral primary osteoarthritis of knee: Secondary | ICD-10-CM | POA: Diagnosis not present

## 2015-10-04 DIAGNOSIS — M7751 Other enthesopathy of right foot: Secondary | ICD-10-CM | POA: Diagnosis not present

## 2015-10-05 DIAGNOSIS — M25561 Pain in right knee: Secondary | ICD-10-CM | POA: Diagnosis not present

## 2015-10-05 DIAGNOSIS — M25562 Pain in left knee: Secondary | ICD-10-CM | POA: Diagnosis not present

## 2015-10-05 DIAGNOSIS — M1711 Unilateral primary osteoarthritis, right knee: Secondary | ICD-10-CM | POA: Diagnosis not present

## 2015-10-05 DIAGNOSIS — R262 Difficulty in walking, not elsewhere classified: Secondary | ICD-10-CM | POA: Diagnosis not present

## 2015-10-08 ENCOUNTER — Encounter: Payer: Self-pay | Admitting: Family Medicine

## 2015-10-08 ENCOUNTER — Ambulatory Visit (INDEPENDENT_AMBULATORY_CARE_PROVIDER_SITE_OTHER): Payer: Medicare Other | Admitting: Family Medicine

## 2015-10-08 VITALS — BP 129/72 | Ht 65.0 in | Wt 185.0 lb

## 2015-10-08 DIAGNOSIS — M25561 Pain in right knee: Secondary | ICD-10-CM | POA: Diagnosis not present

## 2015-10-08 DIAGNOSIS — M6789 Other specified disorders of synovium and tendon, multiple sites: Secondary | ICD-10-CM

## 2015-10-08 DIAGNOSIS — M2142 Flat foot [pes planus] (acquired), left foot: Secondary | ICD-10-CM | POA: Diagnosis not present

## 2015-10-08 DIAGNOSIS — M76829 Posterior tibial tendinitis, unspecified leg: Secondary | ICD-10-CM | POA: Insufficient documentation

## 2015-10-08 DIAGNOSIS — M2141 Flat foot [pes planus] (acquired), right foot: Secondary | ICD-10-CM

## 2015-10-08 DIAGNOSIS — M25562 Pain in left knee: Secondary | ICD-10-CM | POA: Diagnosis not present

## 2015-10-08 DIAGNOSIS — R262 Difficulty in walking, not elsewhere classified: Secondary | ICD-10-CM | POA: Diagnosis not present

## 2015-10-08 DIAGNOSIS — R269 Unspecified abnormalities of gait and mobility: Secondary | ICD-10-CM | POA: Diagnosis not present

## 2015-10-08 DIAGNOSIS — M1711 Unilateral primary osteoarthritis, right knee: Secondary | ICD-10-CM | POA: Diagnosis not present

## 2015-10-08 NOTE — Assessment & Plan Note (Addendum)
Patient was fitted for a standard, cushioned, semi-rigid orthotic. The orthotic was heated and afterward the patient stood on the orthotic blank positioned on the orthotic stand. The patient was positioned in subtalar neutral position and 10 degrees of ankle dorsiflexion in a weight bearing stance. After completion of molding, a stable base was applied to the orthotic blank. The blank was ground to a stable position for weight bearing. Size: 8 Base: Blue EVA Additional Posting and Padding: None The patient ambulated these, and they were very comfortable.  I spent 45 minutes with this patient, greater than 50% was face-to-face time counseling regarding the below diagnosis.

## 2015-10-08 NOTE — Progress Notes (Signed)
  Mary Daniel - 65 y.o. female MRN [id number]  Date of birth: 01/03/1951 Mary Daniel is a 65 y.o. female who presents today for foot pain.  Foot pain, initial visit 10/08/15-patient presents today for ongoing generalized foot pain as well as medial plantar foot pain. His been an ongoing issue for several months.  She saw Dr. Noemi Chapel for medial knee pain and had noticed that she had pes planus and recommended further evaluation. No treatment to date and has never had imaging done. She denies any paresthesias going distally and denies pain that worsens throughout activity. It is worse when she has prolonged sitting or getting up from bed.  PMHx - Updated and reviewed.  Contributory factors include: Primary localized OA of the knees PSHx - Updated and reviewed.  Contributory factors include:  Knee arthroscopy FHx - Updated and reviewed.  Contributory factors include:  Lung cancer maternal Social Hx - Updated and reviewed. Contributory factors include: Nonsmoker  Medications - Mobic daily   ROS Per HPI.  12 point negative other than per HPI.   Exam:  Filed Vitals:   10/08/15 1017  BP: 129/72   Gen: NAD, AAO 3 Cardio- RRR Pulm - Normal respiratory effort/rate Skin: No rashes or erythema Extremities: No edema  Vascular: pulses +2 bilateral upper and lower extremity Psych: Normal affect  Feet: Pes Planus with too many toes sign R>L.  TTP medial process calcaneal tuberosity  Neurovascular intact B/L LE

## 2015-10-12 DIAGNOSIS — R262 Difficulty in walking, not elsewhere classified: Secondary | ICD-10-CM | POA: Diagnosis not present

## 2015-10-12 DIAGNOSIS — M25561 Pain in right knee: Secondary | ICD-10-CM | POA: Diagnosis not present

## 2015-10-12 DIAGNOSIS — M25562 Pain in left knee: Secondary | ICD-10-CM | POA: Diagnosis not present

## 2015-10-12 DIAGNOSIS — M1711 Unilateral primary osteoarthritis, right knee: Secondary | ICD-10-CM | POA: Diagnosis not present

## 2015-10-18 DIAGNOSIS — M25562 Pain in left knee: Secondary | ICD-10-CM | POA: Diagnosis not present

## 2015-10-18 DIAGNOSIS — M25561 Pain in right knee: Secondary | ICD-10-CM | POA: Diagnosis not present

## 2015-11-22 DIAGNOSIS — M25561 Pain in right knee: Secondary | ICD-10-CM | POA: Diagnosis not present

## 2015-11-22 DIAGNOSIS — M25562 Pain in left knee: Secondary | ICD-10-CM | POA: Diagnosis not present

## 2016-02-23 DIAGNOSIS — L309 Dermatitis, unspecified: Secondary | ICD-10-CM | POA: Diagnosis not present

## 2016-02-23 DIAGNOSIS — C44311 Basal cell carcinoma of skin of nose: Secondary | ICD-10-CM | POA: Diagnosis not present

## 2016-03-08 DIAGNOSIS — C44311 Basal cell carcinoma of skin of nose: Secondary | ICD-10-CM | POA: Diagnosis not present

## 2016-03-08 DIAGNOSIS — Z85828 Personal history of other malignant neoplasm of skin: Secondary | ICD-10-CM | POA: Diagnosis not present

## 2016-05-05 ENCOUNTER — Ambulatory Visit (INDEPENDENT_AMBULATORY_CARE_PROVIDER_SITE_OTHER): Payer: Medicare Other | Admitting: Women's Health

## 2016-05-05 ENCOUNTER — Encounter: Payer: Self-pay | Admitting: Women's Health

## 2016-05-05 ENCOUNTER — Other Ambulatory Visit: Payer: Self-pay | Admitting: Women's Health

## 2016-05-05 VITALS — BP 126/80 | Ht 65.0 in | Wt 185.0 lb

## 2016-05-05 DIAGNOSIS — R350 Frequency of micturition: Secondary | ICD-10-CM | POA: Diagnosis not present

## 2016-05-05 DIAGNOSIS — Z23 Encounter for immunization: Secondary | ICD-10-CM | POA: Diagnosis not present

## 2016-05-05 DIAGNOSIS — N3000 Acute cystitis without hematuria: Secondary | ICD-10-CM | POA: Diagnosis not present

## 2016-05-05 LAB — URINALYSIS W MICROSCOPIC + REFLEX CULTURE
Bilirubin Urine: NEGATIVE
Casts: NONE SEEN [LPF]
Crystals: NONE SEEN [HPF]
Glucose, UA: NEGATIVE
Hgb urine dipstick: NEGATIVE
KETONES UR: NEGATIVE
NITRITE: NEGATIVE
Protein, ur: NEGATIVE
SPECIFIC GRAVITY, URINE: 1.015 (ref 1.001–1.035)
YEAST: NONE SEEN [HPF]
pH: 6 (ref 5.0–8.0)

## 2016-05-05 MED ORDER — SULFAMETHOXAZOLE-TRIMETHOPRIM 800-160 MG PO TABS: 1.0000 | ORAL_TABLET | Freq: Two times a day (BID) | ORAL | 0 refills | Status: DC

## 2016-05-05 NOTE — Patient Instructions (Signed)
uti  Urinary Tract Infection, Adult Introduction A urinary tract infection (UTI) is an infection of any part of the urinary tract. The urinary tract includes the:  Kidneys.  Ureters.  Bladder.  Urethra. These organs make, store, and get rid of pee (urine) in the body. Follow these instructions at home:  Take over-the-counter and prescription medicines only as told by your doctor.  If you were prescribed an antibiotic medicine, take it as told by your doctor. Do not stop taking the antibiotic even if you start to feel better.  Avoid the following drinks:  Alcohol.  Caffeine.  Tea.  Carbonated drinks.  Drink enough fluid to keep your pee clear or pale yellow.  Keep all follow-up visits as told by your doctor. This is important.  Make sure to:  Empty your bladder often and completely. Do not to hold pee for long periods of time.  Empty your bladder before and after sex.  Wipe from front to back after a bowel movement if you are female. Use each tissue one time when you wipe. Contact a doctor if:  You have back pain.  You have a fever.  You feel sick to your stomach (nauseous).  You throw up (vomit).  Your symptoms do not get better after 3 days.  Your symptoms go away and then come back. Get help right away if:  You have very bad back pain.  You have very bad lower belly (abdominal) pain.  You are throwing up and cannot keep down any medicines or water. This information is not intended to replace advice given to you by your health care provider. Make sure you discuss any questions you have with your health care provider. Document Released: 11/08/2007 Document Revised: 10/28/2015 Document Reviewed: 04/12/2015  2017 Elsevier

## 2016-05-05 NOTE — Progress Notes (Signed)
Presents with complaint of increased frequency, urgency, pressure sensation and questionable odor with urination for 2 days. States has had some low back ache. History of recurrent UTIs, none in the past year. Denies any vaginal discharge, itching or irritation. Postmenopausal on no HRT with no bleeding.  Exam: Appears well. No CVAT. Abdomen soft, nontender. Slight discomfort with pressure at suprapubic area. UA: +1 leukocytes, 20-40 wbc's, 0 did 2 RBCs, moderate bacteria, 6-10 squamous epithelials  Probable UTI  Plan: Septra twice daily for 3 days prescription, proper use given and reviewed. UTI prevention discussed. Urine culture pending. Instructed to call if no relief of symptoms.

## 2016-05-08 ENCOUNTER — Encounter: Payer: Self-pay | Admitting: Women's Health

## 2016-05-08 LAB — URINE CULTURE

## 2016-05-23 ENCOUNTER — Ambulatory Visit (INDEPENDENT_AMBULATORY_CARE_PROVIDER_SITE_OTHER): Payer: Medicare Other | Admitting: Women's Health

## 2016-05-23 ENCOUNTER — Encounter: Payer: Self-pay | Admitting: Women's Health

## 2016-05-23 VITALS — BP 128/80 | Ht 65.0 in | Wt 194.0 lb

## 2016-05-23 DIAGNOSIS — E2839 Other primary ovarian failure: Secondary | ICD-10-CM | POA: Diagnosis not present

## 2016-05-23 DIAGNOSIS — Z01419 Encounter for gynecological examination (general) (routine) without abnormal findings: Secondary | ICD-10-CM | POA: Diagnosis not present

## 2016-05-23 NOTE — Patient Instructions (Signed)
Vit D3 2000 daily  Health Maintenance for Postmenopausal Women Introduction Menopause is a normal process in which your reproductive ability comes to an end. This process happens gradually over a span of months to years, usually between the ages of 9 and 20. Menopause is complete when you have missed 12 consecutive menstrual periods. It is important to talk with your health care provider about some of the most common conditions that affect postmenopausal women, such as heart disease, cancer, and bone loss (osteoporosis). Adopting a healthy lifestyle and getting preventive care can help to promote your health and wellness. Those actions can also lower your chances of developing some of these common conditions. What should I know about menopause? During menopause, you may experience a number of symptoms, such as:  Moderate-to-severe hot flashes.  Night sweats.  Decrease in sex drive.  Mood swings.  Headaches.  Tiredness.  Irritability.  Memory problems.  Insomnia. Choosing to treat or not to treat menopausal changes is an individual decision that you make with your health care provider. What should I know about hormone replacement therapy and supplements? Hormone therapy products are effective for treating symptoms that are associated with menopause, such as hot flashes and night sweats. Hormone replacement carries certain risks, especially as you become older. If you are thinking about using estrogen or estrogen with progestin treatments, discuss the benefits and risks with your health care provider. What should I know about heart disease and stroke? Heart disease, heart attack, and stroke become more likely as you age. This may be due, in part, to the hormonal changes that your body experiences during menopause. These can affect how your body processes dietary fats, triglycerides, and cholesterol. Heart attack and stroke are both medical emergencies. There are many things that you can  do to help prevent heart disease and stroke:  Have your blood pressure checked at least every 1-2 years. High blood pressure causes heart disease and increases the risk of stroke.  If you are 45-56 years old, ask your health care provider if you should take aspirin to prevent a heart attack or a stroke.  Do not use any tobacco products, including cigarettes, chewing tobacco, or electronic cigarettes. If you need help quitting, ask your health care provider.  It is important to eat a healthy diet and maintain a healthy weight.  Be sure to include plenty of vegetables, fruits, low-fat dairy products, and lean protein.  Avoid eating foods that are high in solid fats, added sugars, or salt (sodium).  Get regular exercise. This is one of the most important things that you can do for your health.  Try to exercise for at least 150 minutes each week. The type of exercise that you do should increase your heart rate and make you sweat. This is known as moderate-intensity exercise.  Try to do strengthening exercises at least twice each week. Do these in addition to the moderate-intensity exercise.  Know your numbers.Ask your health care provider to check your cholesterol and your blood glucose. Continue to have your blood tested as directed by your health care provider. What should I know about cancer screening? There are several types of cancer. Take the following steps to reduce your risk and to catch any cancer development as early as possible. Breast Cancer  Practice breast self-awareness.  This means understanding how your breasts normally appear and feel.  It also means doing regular breast self-exams. Let your health care provider know about any changes, no matter how small.  If you are 39 or older, have a clinician do a breast exam (clinical breast exam or CBE) every year. Depending on your age, family history, and medical history, it may be recommended that you also have a yearly breast  X-ray (mammogram).  If you have a family history of breast cancer, talk with your health care provider about genetic screening.  If you are at high risk for breast cancer, talk with your health care provider about having an MRI and a mammogram every year.  Breast cancer (BRCA) gene test is recommended for women who have family members with BRCA-related cancers. Results of the assessment will determine the need for genetic counseling and BRCA1 and for BRCA2 testing. BRCA-related cancers include these types:  Breast. This occurs in males or females.  Ovarian.  Tubal. This may also be called fallopian tube cancer.  Cancer of the abdominal or pelvic lining (peritoneal cancer).  Prostate.  Pancreatic. Cervical, Uterine, and Ovarian Cancer  Your health care provider may recommend that you be screened regularly for cancer of the pelvic organs. These include your ovaries, uterus, and vagina. This screening involves a pelvic exam, which includes checking for microscopic changes to the surface of your cervix (Pap test).  For women ages 21-65, health care providers may recommend a pelvic exam and a Pap test every three years. For women ages 79-65, they may recommend the Pap test and pelvic exam, combined with testing for human papilloma virus (HPV), every five years. Some types of HPV increase your risk of cervical cancer. Testing for HPV may also be done on women of any age who have unclear Pap test results.  Other health care providers may not recommend any screening for nonpregnant women who are considered low risk for pelvic cancer and have no symptoms. Ask your health care provider if a screening pelvic exam is right for you.  If you have had past treatment for cervical cancer or a condition that could lead to cancer, you need Pap tests and screening for cancer for at least 20 years after your treatment. If Pap tests have been discontinued for you, your risk factors (such as having a new sexual  partner) need to be reassessed to determine if you should start having screenings again. Some women have medical problems that increase the chance of getting cervical cancer. In these cases, your health care provider may recommend that you have screening and Pap tests more often.  If you have a family history of uterine cancer or ovarian cancer, talk with your health care provider about genetic screening.  If you have vaginal bleeding after reaching menopause, tell your health care provider.  There are currently no reliable tests available to screen for ovarian cancer. Lung Cancer  Lung cancer screening is recommended for adults 2-41 years old who are at high risk for lung cancer because of a history of smoking. A yearly low-dose CT scan of the lungs is recommended if you:  Currently smoke.  Have a history of at least 30 pack-years of smoking and you currently smoke or have quit within the past 15 years. A pack-year is smoking an average of one pack of cigarettes per day for one year. Yearly screening should:  Continue until it has been 15 years since you quit.  Stop if you develop a health problem that would prevent you from having lung cancer treatment. Colorectal Cancer  This type of cancer can be detected and can often be prevented.  Routine colorectal cancer screening usually  begins at age 63 and continues through age 53.  If you have risk factors for colon cancer, your health care provider may recommend that you be screened at an earlier age.  If you have a family history of colorectal cancer, talk with your health care provider about genetic screening.  Your health care provider may also recommend using home test kits to check for hidden blood in your stool.  A small camera at the end of a tube can be used to examine your colon directly (sigmoidoscopy or colonoscopy). This is done to check for the earliest forms of colorectal cancer.  Direct examination of the colon should be  repeated every 5-10 years until age 80. However, if early forms of precancerous polyps or small growths are found or if you have a family history or genetic risk for colorectal cancer, you may need to be screened more often. Skin Cancer  Check your skin from head to toe regularly.  Monitor any moles. Be sure to tell your health care provider:  About any new moles or changes in moles, especially if there is a change in a mole's shape or color.  If you have a mole that is larger than the size of a pencil eraser.  If any of your family members has a history of skin cancer, especially at a young age, talk with your health care provider about genetic screening.  Always use sunscreen. Apply sunscreen liberally and repeatedly throughout the day.  Whenever you are outside, protect yourself by wearing long sleeves, pants, a wide-brimmed hat, and sunglasses. What should I know about osteoporosis? Osteoporosis is a condition in which bone destruction happens more quickly than new bone creation. After menopause, you may be at an increased risk for osteoporosis. To help prevent osteoporosis or the bone fractures that can happen because of osteoporosis, the following is recommended:  If you are 29-86 years old, get at least 1,000 mg of calcium and at least 600 mg of vitamin D per day.  If you are older than age 76 but younger than age 63, get at least 1,200 mg of calcium and at least 600 mg of vitamin D per day.  If you are older than age 76, get at least 1,200 mg of calcium and at least 800 mg of vitamin D per day. Smoking and excessive alcohol intake increase the risk of osteoporosis. Eat foods that are rich in calcium and vitamin D, and do weight-bearing exercises several times each week as directed by your health care provider. What should I know about how menopause affects my mental health? Depression may occur at any age, but it is more common as you become older. Common symptoms of depression  include:  Low or sad mood.  Changes in sleep patterns.  Changes in appetite or eating patterns.  Feeling an overall lack of motivation or enjoyment of activities that you previously enjoyed.  Frequent crying spells. Talk with your health care provider if you think that you are experiencing depression. What should I know about immunizations? It is important that you get and maintain your immunizations. These include:  Tetanus, diphtheria, and pertussis (Tdap) booster vaccine.  Influenza every year before the flu season begins.  Pneumonia vaccine.  Shingles vaccine. Your health care provider may also recommend other immunizations. This information is not intended to replace advice given to you by your health care provider. Make sure you discuss any questions you have with your health care provider. Document Released: 07/14/2005 Document Revised: 12/10/2015  Document Reviewed: 02/23/2015  2017 Elsevier

## 2016-05-23 NOTE — Progress Notes (Signed)
Mary Daniel 1951/03/23 [id number]    History:    Presents for breast and pelvic exam.  Postmenopausal, no HRT, no bleeding.  Sexually active, same partner.  Normal Pap history.  Normal mammogram history, last in 2013.  Colonoscopy 2006, has repeat scheduled.  Has not had Pneumovax or Zostavax.   Has never had a DEXA.  Past medical history, past surgical history, family history and social history were all reviewed and documented in the EPIC chart.  Planning a yearly trip to [place] for the month of January. Husband healthy.  Two daughters and 2 grandchildren, all doing well.    ROS:  A ROS was performed and pertinent positives and negatives are included.  Exam:  Vitals:   05/23/16 1148  BP: 128/80  Weight: 194 lb (88 kg)  Height: 5\' 5"  (1.651 m)   Body mass index is 32.28 kg/m.   General appearance:  Normal Thyroid:  Symmetrical, normal in size, without palpable masses or nodularity. Respiratory  Auscultation:  Clear without wheezing or rhonchi Cardiovascular  Auscultation:  Regular rate, without rubs, murmurs or gallops  Edema/varicosities:  Not grossly evident Abdominal  Soft,nontender, without masses, guarding or rebound.  Liver/spleen:  No organomegaly noted  Hernia:  None appreciated  Skin  Inspection:  Grossly normal   Breasts: Examined lying and sitting.     Right: Without masses, retractions, discharge or axillary adenopathy.     Left: Without masses, retractions, discharge or axillary adenopathy. Gentitourinary   Inguinal/mons:  Normal without inguinal adenopathy  External genitalia:  Normal  BUS/Urethra/Skene's glands:  Normal  Vagina:  Atrophic  Cervix:  Normal  Uterus:  Normal in size, shape and contour.  Midline and mobile  Adnexa/parametria:     Rt: Without masses or tenderness.   Lt: Without masses or tenderness.  Anus and perineum: Normal  Digital rectal exam: Normal sphincter tone without palpated masses or tenderness  Assessment/Plan:  65 y.o.   for breast and pelvic exam with no compliants.  Postmenopausal, no HRT, no Bleeding Vaginal Atrophy Skin cancer Mohs procedure-nose- dermatologist managing Primary care manages labs  Plan: Will call to schedule  Mammogram and DEXA.  Colonoscopy scheduled for February.  Recommended Zostavax and Pneumovax.  Encouraged SBEs, annual screening mammogram until no longer healthy, calcium rich diet, Vitamin D 2000 IUs daily, started to have vitamin D level checked at primary care, weight bearing exercise. Continue vaginal lubricants with intercourse. Reviewed home safety and fall precautions.     Huel Cote [facility], 12:51 PM 05/23/2016

## 2016-05-25 DIAGNOSIS — M170 Bilateral primary osteoarthritis of knee: Secondary | ICD-10-CM | POA: Diagnosis not present

## 2016-07-10 DIAGNOSIS — Z1211 Encounter for screening for malignant neoplasm of colon: Secondary | ICD-10-CM | POA: Diagnosis not present

## 2016-07-10 DIAGNOSIS — K5730 Diverticulosis of large intestine without perforation or abscess without bleeding: Secondary | ICD-10-CM | POA: Diagnosis not present

## 2016-07-10 DIAGNOSIS — K640 First degree hemorrhoids: Secondary | ICD-10-CM | POA: Diagnosis not present

## 2016-07-14 ENCOUNTER — Other Ambulatory Visit: Payer: Self-pay | Admitting: Women's Health

## 2016-07-14 ENCOUNTER — Telehealth: Payer: Self-pay | Admitting: *Deleted

## 2016-07-14 DIAGNOSIS — R21 Rash and other nonspecific skin eruption: Secondary | ICD-10-CM

## 2016-07-14 MED ORDER — NYSTATIN 100000 UNIT/GM EX POWD: Freq: Four times a day (QID) | CUTANEOUS | 1 refills | Status: DC

## 2016-07-14 MED ORDER — NYSTATIN 100000 UNIT/GM EX POWD: Freq: Two times a day (BID) | CUTANEOUS | Status: DC

## 2016-07-14 NOTE — Telephone Encounter (Signed)
Telephone call, reviewed nystatin powder Rx E scribed will call if no relief.

## 2016-07-14 NOTE — Telephone Encounter (Signed)
Pt had annual in Dec 2017 declined Rx for powder for rash under her breast, would now like Rx for this. Please advise

## 2016-10-16 DIAGNOSIS — M170 Bilateral primary osteoarthritis of knee: Secondary | ICD-10-CM | POA: Diagnosis not present

## 2016-10-18 ENCOUNTER — Encounter: Payer: Self-pay | Admitting: Gynecology

## 2016-10-18 DIAGNOSIS — R221 Localized swelling, mass and lump, neck: Secondary | ICD-10-CM | POA: Diagnosis not present

## 2016-10-26 ENCOUNTER — Other Ambulatory Visit: Payer: Self-pay | Admitting: Obstetrics & Gynecology

## 2016-10-26 ENCOUNTER — Encounter: Payer: Self-pay | Admitting: Obstetrics & Gynecology

## 2016-10-26 ENCOUNTER — Ambulatory Visit (INDEPENDENT_AMBULATORY_CARE_PROVIDER_SITE_OTHER): Payer: Medicare Other | Admitting: Obstetrics & Gynecology

## 2016-10-26 DIAGNOSIS — R350 Frequency of micturition: Secondary | ICD-10-CM

## 2016-10-26 DIAGNOSIS — R8290 Unspecified abnormal findings in urine: Secondary | ICD-10-CM | POA: Diagnosis not present

## 2016-10-26 LAB — URINALYSIS W MICROSCOPIC + REFLEX CULTURE
Bilirubin Urine: NEGATIVE
CASTS: NONE SEEN [LPF]
Crystals: NONE SEEN [HPF]
Glucose, UA: NEGATIVE
KETONES UR: NEGATIVE
NITRITE: NEGATIVE
PROTEIN: NEGATIVE
SPECIFIC GRAVITY, URINE: 1.025 (ref 1.001–1.035)
WBC, UA: >60 WBC/HPF — AB (ref <=5)
Yeast: NONE SEEN [HPF]
pH: 5 (ref 5.0–8.0)

## 2016-10-26 MED ORDER — SULFAMETHOXAZOLE-TRIMETHOPRIM 800-160 MG PO TABS: 1.0000 | ORAL_TABLET | Freq: Two times a day (BID) | ORAL | 0 refills | Status: DC

## 2016-10-26 NOTE — Progress Notes (Signed)
    Mary Daniel [birth year] [id number]        65 y.o.  [id number]   RP:  Urinary frequency and odor in urine  HPI:  C/O frequent mictions with odor in urine and a general sensation of not being well x 2-3 days.  No burning with miction now, and was not part of her Sxs in the past with bladder infections.  No flank or back pain.  No fever.  Past medical history,surgical history, problem list, medications, allergies, family history and social history were all reviewed and documented in the EPIC chart.  Directed ROS with pertinent positives and negatives documented in the history of present illness/assessment and plan.  Exam:  There were no vitals filed for this visit. General appearance:  Normal  No CVAT bilaterally Abdo soft, no tenderness at suprapubic area  U/A Mild blood and Leuko 2+, otherwise neg.  U. Culture sent.  Assessment/Plan:  66 y.o. G3P2012   1. Frequent urination Bactrim 1 Tab BID x 3 days sent to pharmacy.  Will treat until U. Culture comes back. - Urinalysis with Culture Reflex:  Leuko 2+, mild blood.  2. Abnormal urine odor  - Urinalysis with Culture Reflex  Counseling >50% x 15 minutes on above issue.  Princess Bruins MD, 3:04 PM 10/26/2016

## 2016-10-26 NOTE — Patient Instructions (Signed)
1. Frequent urination Bactrim 1 Tab BID x 3 days sent to pharmacy.  Will treat until U. Culture comes back. - Urinalysis with Culture Reflex:  Leuko 2+, mild blood.  2. Abnormal urine odor  Mary Daniel, it was a pleasure to meet you today!  I will inform you of the Urine Culture as soon as available.

## 2016-10-28 LAB — URINE CULTURE

## 2016-11-01 DIAGNOSIS — J342 Deviated nasal septum: Secondary | ICD-10-CM | POA: Diagnosis not present

## 2016-11-01 DIAGNOSIS — H6523 Chronic serous otitis media, bilateral: Secondary | ICD-10-CM | POA: Diagnosis not present

## 2017-02-01 DIAGNOSIS — M170 Bilateral primary osteoarthritis of knee: Secondary | ICD-10-CM | POA: Diagnosis not present

## 2017-02-12 ENCOUNTER — Other Ambulatory Visit: Payer: Self-pay

## 2017-02-12 ENCOUNTER — Telehealth: Payer: Self-pay

## 2017-02-12 ENCOUNTER — Other Ambulatory Visit: Payer: Self-pay | Admitting: Women's Health

## 2017-02-12 DIAGNOSIS — N3000 Acute cystitis without hematuria: Secondary | ICD-10-CM

## 2017-02-12 MED ORDER — SULFAMETHOXAZOLE-TRIMETHOPRIM 800-160 MG PO TABS: 1.0000 | ORAL_TABLET | Freq: Two times a day (BID) | ORAL | 0 refills | Status: DC
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
## 2017-02-12 NOTE — Telephone Encounter (Signed)
Bactrim twice daily for 3 days, have her get a test of cure UA upon her return. Go to an urgent care if a fever or back pain starts.

## 2017-04-10 ENCOUNTER — Encounter: Payer: Self-pay | Admitting: Women's Health

## 2017-04-10 ENCOUNTER — Ambulatory Visit (INDEPENDENT_AMBULATORY_CARE_PROVIDER_SITE_OTHER): Payer: Medicare Other | Admitting: Women's Health

## 2017-04-10 VITALS — BP 128/80

## 2017-04-10 DIAGNOSIS — R300 Dysuria: Secondary | ICD-10-CM

## 2017-04-10 DIAGNOSIS — Z23 Encounter for immunization: Secondary | ICD-10-CM | POA: Diagnosis not present

## 2017-04-10 DIAGNOSIS — N3000 Acute cystitis without hematuria: Secondary | ICD-10-CM | POA: Diagnosis not present

## 2017-04-10 MED ORDER — ESTRADIOL 10 MCG VA TABS: ORAL_TABLET | VAGINAL | 11 refills | Status: DC

## 2017-04-10 MED ORDER — NITROFURANTOIN MONOHYD MACRO 100 MG PO CAPS: 100.0000 mg | ORAL_CAPSULE | Freq: Two times a day (BID) | ORAL | 0 refills | Status: DC

## 2017-04-10 NOTE — Progress Notes (Signed)
66 yo MWF G3P2presenting with increased urinary frequency/urgency for the past week with accompanying nocturia of 3 times per night and LBP. Denies hematuria, fever, n/v, foul-smelling urine, change in urine appearance, or vaginal discharge. Same partner. History of UTIs, most recent occurring 02/2017, was prescribed Septra which caused headaches. Reports vaginal dryness with occasional, intermittent pruritus from dryness. No health problems other than arthritis.  Exam: Appears well. Abdomen soft and nontender. No CVAT. UA: cloudy appearance, 2+ blood, negative nitrites, 3+ leukocyte esterase, >60 WBC, 10-20 RBC, many bacteria  Urinary tract infection Vaginal atrophy  Plan:  Macrobid 100 mg bid x 7 days. Counseled patient to take with food. Urine Culture pending. Estradiol 10 mcg vaginal tablet, insert daily x 2 weeks, then twice weekly. Reviewed minimal systemic absorption, can also help with urinary symptoms as well as vaginal dryness. Instructed to call or return to office if symptoms do not improve or worsen.

## 2017-04-10 NOTE — Patient Instructions (Signed)

## 2017-04-11 ENCOUNTER — Telehealth: Payer: Self-pay

## 2017-04-11 NOTE — Telephone Encounter (Signed)
Spoke with patient about this. She wants to talk with pharmacy first and see cost of Yuvafem without ins. She will call me back and let me know what she decides to do.

## 2017-04-11 NOTE — Telephone Encounter (Signed)
Ok can try estradiol .02% vag compounded cream from [facility] [place] apply small amount externally 2-3 times weekly. One tube with 6 refills

## 2017-04-11 NOTE — Telephone Encounter (Signed)
Pharmacy sent a not Yuvafem 10 mcg. Vag tab Rx.  "Alternative requested: Not covered. Do you want to change Rx?"

## 2017-04-13 LAB — URINALYSIS W MICROSCOPIC + REFLEX CULTURE
BILIRUBIN URINE: NEGATIVE
Glucose, UA: NEGATIVE
HYALINE CAST: NONE SEEN /LPF
KETONES UR: NEGATIVE
Nitrites, Initial: NEGATIVE
PROTEIN: NEGATIVE
SPECIFIC GRAVITY, URINE: 1.007 (ref 1.001–1.03)
pH: 6 (ref 5.0–8.0)

## 2017-04-13 LAB — URINE CULTURE
MICRO NUMBER:: 81251882
SPECIMEN QUALITY:: ADEQUATE

## 2017-04-13 LAB — CULTURE INDICATED

## 2017-05-07 DIAGNOSIS — M170 Bilateral primary osteoarthritis of knee: Secondary | ICD-10-CM | POA: Diagnosis not present

## 2017-05-07 DIAGNOSIS — E669 Obesity, unspecified: Secondary | ICD-10-CM | POA: Diagnosis not present

## 2017-05-17 DIAGNOSIS — M170 Bilateral primary osteoarthritis of knee: Secondary | ICD-10-CM | POA: Diagnosis not present

## 2017-06-28 DIAGNOSIS — M170 Bilateral primary osteoarthritis of knee: Secondary | ICD-10-CM | POA: Diagnosis not present

## 2017-07-05 DIAGNOSIS — M170 Bilateral primary osteoarthritis of knee: Secondary | ICD-10-CM | POA: Diagnosis not present

## 2017-07-12 DIAGNOSIS — M170 Bilateral primary osteoarthritis of knee: Secondary | ICD-10-CM | POA: Diagnosis not present

## 2017-08-10 DIAGNOSIS — M170 Bilateral primary osteoarthritis of knee: Secondary | ICD-10-CM | POA: Diagnosis not present

## 2017-08-16 DIAGNOSIS — M170 Bilateral primary osteoarthritis of knee: Secondary | ICD-10-CM | POA: Diagnosis not present

## 2017-10-04 DIAGNOSIS — M170 Bilateral primary osteoarthritis of knee: Secondary | ICD-10-CM | POA: Diagnosis not present

## 2017-10-27 DIAGNOSIS — N390 Urinary tract infection, site not specified: Secondary | ICD-10-CM | POA: Diagnosis not present

## 2018-01-03 DIAGNOSIS — M170 Bilateral primary osteoarthritis of knee: Secondary | ICD-10-CM | POA: Diagnosis not present

## 2018-04-15 DIAGNOSIS — M170 Bilateral primary osteoarthritis of knee: Secondary | ICD-10-CM | POA: Diagnosis not present

## 2018-04-26 ENCOUNTER — Other Ambulatory Visit: Payer: Self-pay | Admitting: Internal Medicine

## 2018-04-26 DIAGNOSIS — Z1389 Encounter for screening for other disorder: Secondary | ICD-10-CM | POA: Diagnosis not present

## 2018-04-26 DIAGNOSIS — E669 Obesity, unspecified: Secondary | ICD-10-CM | POA: Diagnosis not present

## 2018-04-26 DIAGNOSIS — Z13820 Encounter for screening for osteoporosis: Secondary | ICD-10-CM | POA: Diagnosis not present

## 2018-04-26 DIAGNOSIS — Z23 Encounter for immunization: Secondary | ICD-10-CM | POA: Diagnosis not present

## 2018-04-26 DIAGNOSIS — Z1231 Encounter for screening mammogram for malignant neoplasm of breast: Secondary | ICD-10-CM

## 2018-04-26 DIAGNOSIS — R030 Elevated blood-pressure reading, without diagnosis of hypertension: Secondary | ICD-10-CM | POA: Diagnosis not present

## 2018-04-26 DIAGNOSIS — Z0000 Encounter for general adult medical examination without abnormal findings: Secondary | ICD-10-CM | POA: Diagnosis not present

## 2018-04-26 DIAGNOSIS — M170 Bilateral primary osteoarthritis of knee: Secondary | ICD-10-CM | POA: Diagnosis not present

## 2018-04-26 DIAGNOSIS — M810 Age-related osteoporosis without current pathological fracture: Secondary | ICD-10-CM

## 2018-04-26 DIAGNOSIS — Z5181 Encounter for therapeutic drug level monitoring: Secondary | ICD-10-CM | POA: Diagnosis not present

## 2018-04-29 ENCOUNTER — Ambulatory Visit: Payer: Medicare Other

## 2018-05-07 ENCOUNTER — Ambulatory Visit
Admission: RE | Admit: 2018-05-07 | Discharge: 2018-05-07 | Disposition: A | Discharge status: Home / Self Care | Payer: Medicare Other | Source: Ambulatory Visit | Attending: Internal Medicine | Admitting: Internal Medicine

## 2018-05-07 DIAGNOSIS — Z1231 Encounter for screening mammogram for malignant neoplasm of breast: Secondary | ICD-10-CM

## 2018-05-09 ENCOUNTER — Other Ambulatory Visit: Payer: Self-pay | Admitting: Internal Medicine

## 2018-05-09 DIAGNOSIS — M25562 Pain in left knee: Secondary | ICD-10-CM | POA: Diagnosis not present

## 2018-05-09 DIAGNOSIS — R928 Other abnormal and inconclusive findings on diagnostic imaging of breast: Secondary | ICD-10-CM

## 2018-05-09 DIAGNOSIS — M6281 Muscle weakness (generalized): Secondary | ICD-10-CM | POA: Diagnosis not present

## 2018-05-09 DIAGNOSIS — M25561 Pain in right knee: Secondary | ICD-10-CM | POA: Diagnosis not present

## 2018-05-09 DIAGNOSIS — R262 Difficulty in walking, not elsewhere classified: Secondary | ICD-10-CM | POA: Diagnosis not present

## 2018-05-15 ENCOUNTER — Other Ambulatory Visit: Payer: Self-pay | Admitting: Internal Medicine

## 2018-05-15 ENCOUNTER — Ambulatory Visit
Admission: RE | Admit: 2018-05-15 | Discharge: 2018-05-15 | Disposition: A | Discharge status: Home / Self Care | Payer: Medicare Other | Source: Ambulatory Visit | Attending: Internal Medicine | Admitting: Internal Medicine

## 2018-05-15 DIAGNOSIS — R928 Other abnormal and inconclusive findings on diagnostic imaging of breast: Secondary | ICD-10-CM

## 2018-05-15 DIAGNOSIS — R921 Mammographic calcification found on diagnostic imaging of breast: Secondary | ICD-10-CM | POA: Diagnosis not present

## 2018-05-27 ENCOUNTER — Encounter: Payer: Self-pay | Admitting: Physician Assistant

## 2018-05-27 DIAGNOSIS — M25561 Pain in right knee: Secondary | ICD-10-CM | POA: Diagnosis not present

## 2018-05-27 DIAGNOSIS — M1711 Unilateral primary osteoarthritis, right knee: Secondary | ICD-10-CM

## 2018-05-27 HISTORY — DX: Unilateral primary osteoarthritis, right knee: M17.11

## 2018-05-27 NOTE — H&P (Signed)
TOTAL KNEE ADMISSION H&P  Patient is being admitted for right total knee arthroplasty.  Subjective:  Chief Complaint:right knee pain.  HPI: Mary Daniel, 67 y.o. female, has a history of pain and functional disability in the right knee due to arthritis and has failed non-surgical conservative treatments for greater than 12 weeks to includeNSAID's and/or analgesics, corticosteriod injections, viscosupplementation injections, flexibility and strengthening excercises, supervised PT with diminished ADL's post treatment, use of assistive devices and activity modification.  Onset of symptoms was gradual, starting 10 years ago with gradually worsening course since that time. The patient noted no past surgery on the right knee(s).  Patient currently rates pain in the right knee(s) at 10 out of 10 with activity. Patient has night pain, worsening of pain with activity and weight bearing, pain that interferes with activities of daily living, crepitus and joint swelling.  Patient has evidence of subchondral sclerosis, periarticular osteophytes and joint space narrowing by imaging studies.There is no active infection.  Patient Active Problem List   Diagnosis Date Noted  . Primary localized osteoarthritis of right knee 05/27/2018  . Pes planus of both feet 10/08/2015  . Posterior tibialis tendon insufficiency 10/08/2015  . Abnormality of gait 10/08/2015  . History of recurrent UTIs 02/26/2013  . Arthritis    Past Medical History:  Diagnosis Date  . Arthritis   . Primary localized osteoarthritis of right knee 05/27/2018    Past Surgical History:  Procedure Laterality Date  . KNEE SURGERY    . TUBAL LIGATION      No current facility-administered medications for this encounter.    Current Outpatient Medications  Medication Sig Dispense Refill Last Dose  . acetaminophen (TYLENOL) 500 MG tablet Take 1,000 mg by mouth every 6 (six) hours as needed for moderate pain.   05/27/2018 at Unknown time  .  diclofenac sodium (VOLTAREN) 1 % GEL Apply 2 g topically at bedtime as needed (knee pain).   05/26/2018 at Unknown time  . Estradiol 10 MCG TABS vaginal tablet Insert vaginally daily for 2 weeks and then twice weekly (Patient not taking: Reported on 05/21/2018) 18 tablet 11 Not Taking at Unknown time  . nitrofurantoin, macrocrystal-monohydrate, (MACROBID) 100 MG capsule Take 1 capsule (100 mg total) 2 (two) times daily by mouth. (Patient not taking: Reported on 05/21/2018) 14 capsule 0 Not Taking at Unknown time   No Known Allergies  Social History   Tobacco Use  . Smoking status: Never Smoker  . Smokeless tobacco: Never Used  Substance Use Topics  . Alcohol use: Yes    Comment: occasional    Family History  Problem Relation Age of Onset  . Cancer Mother        LUNG  . Hypertension Mother   . Osteoporosis Father   . Cancer Father        BLADDER  . Osteoporosis Paternal Grandmother   . Cancer Maternal Aunt        Lung cancer     Review of Systems  Constitutional: Negative.   HENT: Negative.   Eyes: Negative.   Respiratory: Negative.   Cardiovascular: Negative.   Gastrointestinal: Negative.   Genitourinary: Negative.   Musculoskeletal: Positive for joint pain.  Skin: Negative.   Neurological: Negative.   Endo/Heme/Allergies: Negative.   Psychiatric/Behavioral: Negative.     Objective:  Physical Exam  Constitutional: She is oriented to person, place, and time. She appears well-developed and well-nourished.  HENT:  Head: Normocephalic and atraumatic.  Mouth/Throat: Oropharynx is clear and moist.  Eyes: Pupils are equal, round, and reactive to light. Conjunctivae and EOM are normal.  Neck: Neck supple.  Cardiovascular: Normal rate.  Respiratory: Effort normal and breath sounds normal.  GI: Soft. Bowel sounds are normal.  Genitourinary:    Genitourinary Comments: Not pertinent to current symptomatology therefore not examined.   Musculoskeletal:     Comments:  Examination of bilateral knees reveal pain bilaterally.  2+ crepitation.  2+ synovitis.  Range of motion -5-120 degrees.  Varus deformity  Knees are stable with normal patella tracking.  Vascular exam: Pulses are 2+ and symmetric.  Neurologic exam: Distal motor and sensory examination is within normal limits.    Neurological: She is alert and oriented to person, place, and time.  Skin: Skin is warm and dry.  Psychiatric: She has a normal mood and affect. Her behavior is normal.    Vital signs in last 24 hours: Temp:  [98 F (36.7 C)] 98 F (36.7 C) (12/23 1700) Pulse Rate:  [82] 82 (12/23 1700) BP: (136)/(83) 136/83 (12/23 1700) Weight:  [80.7 kg] 80.7 kg (12/23 1700)  Labs:   Estimated body mass index is 26.29 kg/m as calculated from the following:   Height as of this encounter: 5\' 9"  (1.753 m).   Weight as of this encounter: 80.7 kg.   Imaging Review Plain radiographs demonstrate severe degenerative joint disease of the right knee(s). The overall alignment issignificant varus. The bone quality appears to be good for age and reported activity level.   Preoperative templating of the joint replacement has been completed, documented, and submitted to the Operating Room personnel in order to optimize intra-operative equipment management.    Patient's anticipated LOS is less than 2 midnights, meeting these requirements: - Younger than 46 - Lives within 1 hour of care - Has a competent adult at home to recover with post-op recover - NO history of  - Chronic pain requiring opiods  - Diabetes  - Coronary Artery Disease  - Heart failure  - Heart attack  - Stroke  - DVT/VTE  - Cardiac arrhythmia  - Respiratory Failure/COPD  - Renal failure  - Anemia  - Advanced Liver disease        Assessment/Plan:  End stage arthritis, right knee   The patient history, physical examination, clinical judgment of the provider and imaging studies are consistent with end stage  degenerative joint disease of the right knee(s) and total knee arthroplasty is deemed medically necessary. The treatment options including medical management, injection therapy arthroscopy and arthroplasty were discussed at length. The risks and benefits of total knee arthroplasty were presented and reviewed. The risks due to aseptic loosening, infection, stiffness, patella tracking problems, thromboembolic complications and other imponderables were discussed. The patient acknowledged the explanation, agreed to proceed with the plan and consent was signed. Patient is being admitted for inpatient treatment for surgery, pain control, PT, OT, prophylactic antibiotics, VTE prophylaxis, progressive ambulation and ADL's and discharge planning. The patient is planning to be discharged home with home health services

## 2018-05-30 NOTE — Pre-Procedure Instructions (Signed)
TEIANA HAJDUK  05/30/2018      [id number] - [place], [place] - [address] [place] [address] Phone: [phone] Fax: [phone]  [phone] - [place], [address] AT [address] [place] [address] Phone: [phone] Fax: [phone]    Your procedure is scheduled on January 6th.  Report to [facility] Admitting at [place].M.  Call this number if you have problems the morning of surgery:  [phone]   Remember:  Do not eat or drink after midnight.    Take these medicines the morning of surgery with A SIP OF WATER  acetaminophen (TYLENOL) if needed  7 days prior to surgery STOP taking any diclofenac sodium (VOLTAREN),  Aspirin (unless otherwise instructed by your surgeon), Aleve, Naproxen, Ibuprofen, Motrin, Advil, Goody's, BC's, all herbal medications, fish oil, and all vitamins.     Do not wear jewelry, make-up or nail polish.  Do not wear lotions, powders, or perfumes, or deodorant.  Do not shave 48 hours prior to surgery.  Men may shave face and neck.  Do not bring valuables to the hospital.  [facility] is not responsible for any belongings or valuables.  Contacts, dentures or bridgework may not be worn into surgery.  Leave your suitcase in the car.  After surgery it may be brought to your room.  For patients admitted to the hospital, discharge time will be determined by your treatment team.  Patients discharged the day of surgery will not be allowed to drive home.    [facility]- Preparing For Surgery  Before surgery, you can play an important role. Because skin is not sterile, your skin needs to be as free of germs as possible. You can reduce the number of germs on your skin by washing with CHG (chlorahexidine gluconate) Soap before surgery.  CHG is an antiseptic cleaner which kills  germs and bonds with the skin to continue killing germs even after washing.    Oral Hygiene is also important to reduce your risk of infection.  Remember - BRUSH YOUR TEETH THE MORNING OF SURGERY WITH YOUR REGULAR TOOTHPASTE  Please do not use if you have an allergy to CHG or antibacterial soaps. If your skin becomes reddened/irritated stop using the CHG.  Do not shave (including legs and underarms) for at least 48 hours prior to first CHG shower. It is OK to shave your face.  Please follow these instructions carefully.   1. Shower the NIGHT BEFORE SURGERY and the MORNING OF SURGERY with CHG.   2. If you chose to wash your hair, wash your hair first as usual with your normal shampoo.  3. After you shampoo, rinse your hair and body thoroughly to remove the shampoo.  4. Use CHG as you would any other liquid soap. You can apply CHG directly to the skin and wash gently with a scrungie or a clean washcloth.   5. Apply the CHG Soap to your body ONLY FROM THE NECK DOWN.  Do not use on open wounds or open sores. Avoid contact with your eyes, ears, mouth and genitals (private parts). Wash Face and genitals (private parts)  with your normal soap.  6. Wash thoroughly, paying special attention to the area where your surgery will be performed.  7. Thoroughly rinse your body with warm water from the neck  down.  8. DO NOT shower/wash with your normal soap after using and rinsing off the CHG Soap.  9. Pat yourself dry with a CLEAN TOWEL.  10. Wear CLEAN PAJAMAS to bed the night before surgery, wear comfortable clothes the morning of surgery  11. Place CLEAN SHEETS on your bed the night of your first shower and DO NOT SLEEP WITH PETS.    Day of Surgery:  Do not apply any deodorants/lotions.  Please wear clean clothes to the hospital/surgery center.   Remember to brush your teeth WITH YOUR REGULAR TOOTHPASTE.    Please read over the following fact sheets that you were given.

## 2018-05-31 ENCOUNTER — Encounter (HOSPITAL_COMMUNITY): Payer: Self-pay

## 2018-05-31 ENCOUNTER — Encounter (HOSPITAL_COMMUNITY)
Admission: RE | Admit: 2018-05-31 | Discharge: 2018-05-31 | Disposition: A | Discharge status: Home / Self Care | Payer: Medicare Other | Source: Ambulatory Visit | Attending: Orthopedic Surgery | Admitting: Orthopedic Surgery

## 2018-05-31 ENCOUNTER — Other Ambulatory Visit: Payer: Self-pay

## 2018-05-31 DIAGNOSIS — Z01812 Encounter for preprocedural laboratory examination: Secondary | ICD-10-CM | POA: Diagnosis not present

## 2018-05-31 HISTORY — DX: Unilateral primary osteoarthritis, unspecified knee: M17.10

## 2018-05-31 HISTORY — DX: Diverticulosis of intestine, part unspecified, without perforation or abscess without bleeding: K57.90

## 2018-05-31 HISTORY — DX: Headache: R51

## 2018-05-31 HISTORY — DX: Presence of spectacles and contact lenses: Z97.3

## 2018-05-31 HISTORY — DX: Other specified postprocedural states: Z98.890

## 2018-05-31 HISTORY — DX: Nausea with vomiting, unspecified: R11.2

## 2018-05-31 HISTORY — DX: Gastro-esophageal reflux disease without esophagitis: K21.9

## 2018-05-31 HISTORY — DX: Headache, unspecified: R51.9

## 2018-05-31 LAB — COMPREHENSIVE METABOLIC PANEL
ALBUMIN: 4.1 g/dL (ref 3.5–5.0)
ALT: 20 U/L (ref 0–44)
AST: 22 U/L (ref 15–41)
Alkaline Phosphatase: 78 U/L (ref 38–126)
Anion gap: 9 (ref 5–15)
BUN: 9 mg/dL (ref 8–23)
CO2: 25 mmol/L (ref 22–32)
Calcium: 9.5 mg/dL (ref 8.9–10.3)
Chloride: 107 mmol/L (ref 98–111)
Creatinine, Ser: 0.66 mg/dL (ref 0.44–1.00)
GFR calc Af Amer: >60 mL/min (ref >60)
GFR calc non Af Amer: >60 mL/min (ref >60)
GLUCOSE: 105 mg/dL — AB (ref 70–99)
POTASSIUM: 4.2 mmol/L (ref 3.5–5.1)
Sodium: 141 mmol/L (ref 135–145)
Total Bilirubin: 0.6 mg/dL (ref 0.3–1.2)
Total Protein: 6.8 g/dL (ref 6.5–8.1)

## 2018-05-31 LAB — CBC WITH DIFFERENTIAL/PLATELET
Abs Immature Granulocytes: 0.01 10*3/uL (ref 0.00–0.07)
Basophils Absolute: 0 10*3/uL (ref 0.0–0.1)
Basophils Relative: 1 %
Eosinophils Absolute: 0.1 10*3/uL (ref 0.0–0.5)
Eosinophils Relative: 1 %
HCT: 41.3 % (ref 36.0–46.0)
HEMOGLOBIN: 13.1 g/dL (ref 12.0–15.0)
Immature Granulocytes: 0 %
LYMPHS ABS: 1.7 10*3/uL (ref 0.7–4.0)
Lymphocytes Relative: 26 %
MCH: 29.8 pg (ref 26.0–34.0)
MCHC: 31.7 g/dL (ref 30.0–36.0)
MCV: 93.9 fL (ref 80.0–100.0)
Monocytes Absolute: 0.4 10*3/uL (ref 0.1–1.0)
Monocytes Relative: 7 %
NRBC: 0 % (ref 0.0–0.2)
Neutro Abs: 4.3 10*3/uL (ref 1.7–7.7)
Neutrophils Relative %: 65 %
Platelets: 223 10*3/uL (ref 150–400)
RBC: 4.4 MIL/uL (ref 3.87–5.11)
RDW: 13.4 % (ref 11.5–15.5)
WBC: 6.6 10*3/uL (ref 4.0–10.5)

## 2018-05-31 LAB — TYPE AND SCREEN
ABO/RH(D): A POS
Antibody Screen: NEGATIVE

## 2018-05-31 LAB — APTT: aPTT: 31 seconds (ref 24–36)

## 2018-05-31 LAB — SURGICAL PCR SCREEN
MRSA, PCR: POSITIVE — AB
Staphylococcus aureus: POSITIVE — AB

## 2018-05-31 LAB — PROTIME-INR
INR: 1.02
Prothrombin Time: 13.3 seconds (ref 11.4–15.2)

## 2018-05-31 LAB — ABO/RH: ABO/RH(D): A POS

## 2018-05-31 NOTE — Progress Notes (Signed)
Pt denies SOB, chest pain, and being under the care of a cardiologist. Pt denies having a stress test, echo and cardiac cath. Pt denies having a chest x ray within the last year. Pt stated that an EKG was performed by PCP, Dr. Laurann Montana at [place]; records requested (LOV note and EKG). Pt denies recent labs. Urine culture pendng at PAT visit. Pt verbalized understanding of all pre-op instructions.

## 2018-06-01 LAB — URINE CULTURE: Culture: <10000 — AB

## 2018-06-03 NOTE — Progress Notes (Signed)
Left voice message with Sherrie, Surgical Coordinator, to f/u with pt regarding emails that she received.

## 2018-06-06 NOTE — Progress Notes (Signed)
Called Dr. Archie Endo office and spoke with [facility] informing her of abnormal urinalysis and urine culture.  She verbalized understanding and said she would inform Dr. Archie Endo assistant.

## 2018-06-07 ENCOUNTER — Other Ambulatory Visit: Payer: Self-pay | Admitting: Orthopedic Surgery

## 2018-06-07 MED ORDER — CEFAZOLIN SODIUM-DEXTROSE 2-4 GM/100ML-% IV SOLN
2.0000 g | INTRAVENOUS | Status: AC
  Administered 2018-06-10: 2 g via INTRAVENOUS
  Filled 2018-06-07: qty 100

## 2018-06-07 MED ORDER — VANCOMYCIN HCL IN DEXTROSE 1-5 GM/200ML-% IV SOLN
1000.0000 mg | INTRAVENOUS | Status: AC
  Administered 2018-06-10: 1000 mg via INTRAVENOUS
  Filled 2018-06-07: qty 200

## 2018-06-07 MED ORDER — TRANEXAMIC ACID-NACL 1000-0.7 MG/100ML-% IV SOLN
1000.0000 mg | INTRAVENOUS | Status: AC
  Administered 2018-06-10: 1000 mg via INTRAVENOUS
  Filled 2018-06-07: qty 100

## 2018-06-07 MED ORDER — BUPIVACAINE LIPOSOME 1.3 % IJ SUSP
20.0000 mL | INTRAMUSCULAR | Status: DC
  Filled 2018-06-07: qty 20

## 2018-06-07 NOTE — Progress Notes (Signed)
Anesthesia Chart Review:  Case:  [id number] Date/Time:  06/10/18 0730   Procedure:  TOTAL KNEE ARTHROPLASTY (Right )   Anesthesia type:  Spinal   Pre-op diagnosis:  djd right knee   Location:  [facility] OR ROOM 07 / [facility]   Surgeon:  Elsie Saas, MD      DISCUSSION: Patient is a 68 year old female scheduled for the above procedure.  History includes never smoker, post-operative N/V, GERD, migraines.   She had recent evaluation by her PCP with EKG. He is aware of surgery plans. She denied SOB and chest pain. No reported HTN history. Her BP was elevated at PAT (163/71)--previously 142/82 at [facility]. She will get vitals on arrival, but based on currently available information I would anticipate that she can proceed as planned if no acute changes.   VS: BP (!) 163/71   Pulse 70   Temp 36.4 C (Oral)   Resp 18   Ht 5\' 4"  (1.626 m)   Wt 80.5 kg   SpO2 100%   BMI 30.48 kg/m   PROVIDERS: Lavone Orn, MD is PCP [facility]). Last visit 04/26/18 for Medicare exam with EKG and labs. He is aware of surgery plans. She is apparently also planning left [facility] at [facility] later this year.    LABS: Labs reviewed: Acceptable for surgery. Urine culture showing insignificant growth called to surgeon's office by Short Stay RN. (all labs ordered are listed, but only abnormal results are displayed)  Labs Reviewed  SURGICAL PCR SCREEN - Abnormal; Notable for the following components:      Result Value   MRSA, PCR POSITIVE (*)    Staphylococcus aureus POSITIVE (*)    All other components within normal limits  URINE CULTURE - Abnormal; Notable for the following components:   Culture   (*)    Value: <10,000 COLONIES/mL INSIGNIFICANT GROWTH Performed at [facility], [address], [place], [place] [address]    All other components within normal limits  COMPREHENSIVE METABOLIC PANEL - Abnormal; Notable for the following components:   Glucose, Bld 105 (*)    All other components within  normal limits  APTT  CBC WITH DIFFERENTIAL/PLATELET  PROTIME-INR  TYPE AND SCREEN  ABO/RH    EKG: 04/26/18 [facility]): Sinus rhythm.  Short PR (PR interval 110 ms).  Low voltage in precordial leads.  Left atrial enlargement. (EKG reviewed by Dr. Laurann Montana. No additional recommended documented. No comparison EKGs in [facility] or [facility].)   CV: N/A  Past Medical History:  Diagnosis Date  . Arthritis   . Diverticulosis   . GERD (gastroesophageal reflux disease)   . Headache    migraines  . PONV (postoperative nausea and vomiting)   . Primary localized osteoarthritis of knee    right  . Primary localized osteoarthritis of right knee 05/27/2018  . Wears glasses     Past Surgical History:  Procedure Laterality Date  . COLONOSCOPY    . KNEE SURGERY    . TUBAL LIGATION      MEDICATIONS: . acetaminophen (TYLENOL) 500 MG tablet  . diclofenac sodium (VOLTAREN) 1 % GEL  . Estradiol 10 MCG TABS vaginal tablet  . nitrofurantoin, macrocrystal-monohydrate, (MACROBID) 100 MG capsule   No current facility-administered medications for this encounter.   Estradiol and nitrofurantoin are listed as not currently taking.   Myra Gianotti, PA-C Surgical Short Stay/Anesthesiology [facility] Phone [phone] [facility] Phone [phone] 06/07/2018 9:26 AM

## 2018-06-07 NOTE — Care Plan (Signed)
Spoke with patient prior to surgery. Post acute care arranged with [facility] and equipment has been delivered.  Please contact me with questions.   Thank you   Ladell Heads, [place]

## 2018-06-07 NOTE — Anesthesia Preprocedure Evaluation (Addendum)
Anesthesia Evaluation  Patient identified by MRN, date of birth, ID band Patient awake    Reviewed: Allergy & Precautions, H&P , NPO status , Patient's Chart, lab work & pertinent test results, reviewed documented beta blocker date and time   History of Anesthesia Complications (+) PONV  Airway Mallampati: II  TM Distance: >3 FB Neck ROM: full    Dental no notable dental hx.    Pulmonary neg pulmonary ROS,    Pulmonary exam normal breath sounds clear to auscultation       Cardiovascular Exercise Tolerance: Good negative cardio ROS   Rhythm:regular Rate:Normal     Neuro/Psych  Headaches, negative psych ROS   GI/Hepatic Neg liver ROS, GERD  Medicated,  Endo/Other  negative endocrine ROS  Renal/GU negative Renal ROS  negative genitourinary   Musculoskeletal  (+) Arthritis , Osteoarthritis,    Abdominal   Peds  Hematology negative hematology ROS (+)   Anesthesia Other Findings   Reproductive/Obstetrics negative OB ROS                             Anesthesia Physical Anesthesia Plan  ASA: III  Anesthesia Plan: MAC and Spinal   Post-op Pain Management:  Regional for Post-op pain   Induction:   PONV Risk Score and Plan: 3 and Ondansetron and Treatment may vary due to age or medical condition  Airway Management Planned: Nasal Cannula and Natural Airway  Additional Equipment:   Intra-op Plan:   Post-operative Plan:   Informed Consent: I have reviewed the patients History and Physical, chart, labs and discussed the procedure including the risks, benefits and alternatives for the proposed anesthesia with the patient or authorized representative who has indicated his/her understanding and acceptance.   Dental Advisory Given  Plan Discussed with: CRNA, Anesthesiologist and Surgeon  Anesthesia Plan Comments: (PAT note written 06/07/2018 by Myra Gianotti, PA-C. )        Anesthesia Quick Evaluation

## 2018-06-10 ENCOUNTER — Encounter (HOSPITAL_COMMUNITY): Payer: Self-pay | Admitting: *Deleted

## 2018-06-10 ENCOUNTER — Inpatient Hospital Stay (HOSPITAL_COMMUNITY): Payer: Medicare Other | Admitting: Physician Assistant

## 2018-06-10 ENCOUNTER — Inpatient Hospital Stay (HOSPITAL_COMMUNITY)
Admission: RE | Admit: 2018-06-10 | Discharge: 2018-06-11 | DRG: 470 | Disposition: A | Discharge status: Home Health | Payer: Medicare Other | Attending: Orthopedic Surgery | Admitting: Orthopedic Surgery

## 2018-06-10 ENCOUNTER — Inpatient Hospital Stay (HOSPITAL_COMMUNITY): Payer: Medicare Other | Admitting: Certified Registered"

## 2018-06-10 ENCOUNTER — Encounter (HOSPITAL_COMMUNITY)
Admission: RE | Disposition: A | Discharge status: Home Health | Payer: Self-pay | Source: Home / Self Care | Attending: Orthopedic Surgery

## 2018-06-10 DIAGNOSIS — Z22321 Carrier or suspected carrier of Methicillin susceptible Staphylococcus aureus: Secondary | ICD-10-CM | POA: Diagnosis not present

## 2018-06-10 DIAGNOSIS — Z22322 Carrier or suspected carrier of Methicillin resistant Staphylococcus aureus: Secondary | ICD-10-CM | POA: Diagnosis not present

## 2018-06-10 DIAGNOSIS — Z79899 Other long term (current) drug therapy: Secondary | ICD-10-CM | POA: Diagnosis not present

## 2018-06-10 DIAGNOSIS — Z8262 Family history of osteoporosis: Secondary | ICD-10-CM | POA: Diagnosis not present

## 2018-06-10 DIAGNOSIS — K219 Gastro-esophageal reflux disease without esophagitis: Secondary | ICD-10-CM | POA: Diagnosis not present

## 2018-06-10 DIAGNOSIS — G8918 Other acute postprocedural pain: Secondary | ICD-10-CM | POA: Diagnosis not present

## 2018-06-10 DIAGNOSIS — Z8249 Family history of ischemic heart disease and other diseases of the circulatory system: Secondary | ICD-10-CM | POA: Diagnosis not present

## 2018-06-10 DIAGNOSIS — M2141 Flat foot [pes planus] (acquired), right foot: Secondary | ICD-10-CM | POA: Diagnosis present

## 2018-06-10 DIAGNOSIS — M1711 Unilateral primary osteoarthritis, right knee: Principal | ICD-10-CM | POA: Diagnosis present

## 2018-06-10 DIAGNOSIS — E669 Obesity, unspecified: Secondary | ICD-10-CM | POA: Diagnosis present

## 2018-06-10 DIAGNOSIS — R269 Unspecified abnormalities of gait and mobility: Secondary | ICD-10-CM

## 2018-06-10 DIAGNOSIS — Z96659 Presence of unspecified artificial knee joint: Secondary | ICD-10-CM

## 2018-06-10 DIAGNOSIS — Z79890 Hormone replacement therapy (postmenopausal): Secondary | ICD-10-CM

## 2018-06-10 DIAGNOSIS — Z6826 Body mass index (BMI) 26.0-26.9, adult: Secondary | ICD-10-CM

## 2018-06-10 DIAGNOSIS — Z7982 Long term (current) use of aspirin: Secondary | ICD-10-CM

## 2018-06-10 DIAGNOSIS — M2142 Flat foot [pes planus] (acquired), left foot: Secondary | ICD-10-CM | POA: Diagnosis not present

## 2018-06-10 DIAGNOSIS — M25761 Osteophyte, right knee: Secondary | ICD-10-CM | POA: Diagnosis present

## 2018-06-10 DIAGNOSIS — Z973 Presence of spectacles and contact lenses: Secondary | ICD-10-CM

## 2018-06-10 DIAGNOSIS — M76829 Posterior tibial tendinitis, unspecified leg: Secondary | ICD-10-CM | POA: Diagnosis present

## 2018-06-10 HISTORY — PX: TOTAL KNEE ARTHROPLASTY: SHX125

## 2018-06-10 HISTORY — DX: Unilateral primary osteoarthritis, right knee: M17.11

## 2018-06-10 SURGERY — ARTHROPLASTY, KNEE, TOTAL
Anesthesia: Monitor Anesthesia Care | Site: Knee | Laterality: Right

## 2018-06-10 MED ORDER — 0.9 % SODIUM CHLORIDE (POUR BTL) OPTIME
TOPICAL | Status: DC | PRN
  Administered 2018-06-10: 1000 mL

## 2018-06-10 MED ORDER — CLONIDINE HCL (ANALGESIA) 100 MCG/ML EP SOLN
EPIDURAL | Status: DC | PRN
  Administered 2018-06-10: 100 ug

## 2018-06-10 MED ORDER — SUCCINYLCHOLINE CHLORIDE 200 MG/10ML IV SOSY
PREFILLED_SYRINGE | INTRAVENOUS | Status: AC
  Filled 2018-06-10: qty 10

## 2018-06-10 MED ORDER — OXYCODONE HCL 5 MG PO TABS
ORAL_TABLET | ORAL | Status: AC
  Filled 2018-06-10: qty 2

## 2018-06-10 MED ORDER — ROPIVACAINE HCL 7.5 MG/ML IJ SOLN
INTRAMUSCULAR | Status: DC | PRN
  Administered 2018-06-10: 30 mL via PERINEURAL

## 2018-06-10 MED ORDER — PHENYLEPHRINE 40 MCG/ML (10ML) SYRINGE FOR IV PUSH (FOR BLOOD PRESSURE SUPPORT)
PREFILLED_SYRINGE | INTRAVENOUS | Status: AC
  Filled 2018-06-10: qty 10

## 2018-06-10 MED ORDER — MIDAZOLAM HCL 5 MG/5ML IJ SOLN
INTRAMUSCULAR | Status: DC | PRN
  Administered 2018-06-10: 2 mg via INTRAVENOUS

## 2018-06-10 MED ORDER — EPHEDRINE 5 MG/ML INJ
INTRAVENOUS | Status: AC
  Filled 2018-06-10: qty 10

## 2018-06-10 MED ORDER — PROPOFOL 500 MG/50ML IV EMUL
INTRAVENOUS | Status: DC | PRN
  Administered 2018-06-10: 75 ug/kg/min via INTRAVENOUS

## 2018-06-10 MED ORDER — OXYCODONE HCL 5 MG PO TABS
5.0000 mg | ORAL_TABLET | ORAL | Status: DC | PRN
  Administered 2018-06-10 – 2018-06-11 (×4): 10 mg via ORAL
  Filled 2018-06-10 (×3): qty 2

## 2018-06-10 MED ORDER — TOBRAMYCIN SULFATE 1.2 G IJ SOLR
INTRAMUSCULAR | Status: DC | PRN
  Administered 2018-06-10: 1.2 g

## 2018-06-10 MED ORDER — CHLORHEXIDINE GLUCONATE 4 % EX LIQD: 60.0000 mL | Freq: Once | CUTANEOUS | Status: DC

## 2018-06-10 MED ORDER — DEXAMETHASONE SODIUM PHOSPHATE 10 MG/ML IJ SOLN
INTRAMUSCULAR | Status: AC
  Filled 2018-06-10: qty 1

## 2018-06-10 MED ORDER — PROPOFOL 10 MG/ML IV BOLUS
INTRAVENOUS | Status: AC
  Filled 2018-06-10: qty 20

## 2018-06-10 MED ORDER — TOBRAMYCIN SULFATE 1.2 G IJ SOLR
INTRAMUSCULAR | Status: AC
  Filled 2018-06-10: qty 1.2

## 2018-06-10 MED ORDER — DEXAMETHASONE SODIUM PHOSPHATE 10 MG/ML IJ SOLN
INTRAMUSCULAR | Status: DC | PRN
  Administered 2018-06-10: 10 mg via INTRAVENOUS

## 2018-06-10 MED ORDER — CEFAZOLIN SODIUM-DEXTROSE 2-4 GM/100ML-% IV SOLN
2.0000 g | Freq: Four times a day (QID) | INTRAVENOUS | Status: AC
  Administered 2018-06-10 (×2): 2 g via INTRAVENOUS
  Filled 2018-06-10 (×2): qty 100

## 2018-06-10 MED ORDER — FENTANYL CITRATE (PF) 100 MCG/2ML IJ SOLN: 25.0000 ug | INTRAMUSCULAR | Status: DC | PRN

## 2018-06-10 MED ORDER — ACETAMINOPHEN 500 MG PO TABS
1000.0000 mg | ORAL_TABLET | Freq: Four times a day (QID) | ORAL | Status: AC
  Administered 2018-06-10 – 2018-06-11 (×3): 1000 mg via ORAL
  Filled 2018-06-10 (×4): qty 2

## 2018-06-10 MED ORDER — VANCOMYCIN HCL IN DEXTROSE 1-5 GM/200ML-% IV SOLN
1000.0000 mg | Freq: Two times a day (BID) | INTRAVENOUS | Status: AC
  Administered 2018-06-10 – 2018-06-11 (×2): 1000 mg via INTRAVENOUS
  Filled 2018-06-10 (×2): qty 200

## 2018-06-10 MED ORDER — BUPIVACAINE IN DEXTROSE 0.75-8.25 % IT SOLN
INTRATHECAL | Status: DC | PRN
  Administered 2018-06-10: 1.6 mL via INTRATHECAL

## 2018-06-10 MED ORDER — ONDANSETRON HCL 4 MG/2ML IJ SOLN
4.0000 mg | Freq: Four times a day (QID) | INTRAMUSCULAR | Status: DC | PRN
  Administered 2018-06-10 – 2018-06-11 (×2): 4 mg via INTRAVENOUS
  Filled 2018-06-10 (×2): qty 2

## 2018-06-10 MED ORDER — LIDOCAINE 2% (20 MG/ML) 5 ML SYRINGE
INTRAMUSCULAR | Status: AC
  Filled 2018-06-10: qty 5

## 2018-06-10 MED ORDER — POTASSIUM CHLORIDE IN NACL 20-0.9 MEQ/L-% IV SOLN
INTRAVENOUS | Status: DC
  Administered 2018-06-10: 16:00:00 via INTRAVENOUS
  Filled 2018-06-10 (×2): qty 1000

## 2018-06-10 MED ORDER — POVIDONE-IODINE 7.5 % EX SOLN: Freq: Once | CUTANEOUS | Status: DC

## 2018-06-10 MED ORDER — SODIUM CHLORIDE 0.9 % IV SOLN
INTRAVENOUS | Status: DC | PRN
  Administered 2018-06-10: 60 mL

## 2018-06-10 MED ORDER — METOCLOPRAMIDE HCL 5 MG PO TABS: 5.0000 mg | ORAL_TABLET | Freq: Three times a day (TID) | ORAL | Status: DC | PRN

## 2018-06-10 MED ORDER — ONDANSETRON HCL 4 MG PO TABS: 4.0000 mg | ORAL_TABLET | Freq: Four times a day (QID) | ORAL | Status: DC | PRN

## 2018-06-10 MED ORDER — BUPIVACAINE-EPINEPHRINE (PF) 0.25% -1:200000 IJ SOLN
INTRAMUSCULAR | Status: DC | PRN
  Administered 2018-06-10: 30 mL

## 2018-06-10 MED ORDER — TRANEXAMIC ACID-NACL 1000-0.7 MG/100ML-% IV SOLN
1000.0000 mg | Freq: Once | INTRAVENOUS | Status: AC
  Administered 2018-06-10: 1000 mg via INTRAVENOUS
  Filled 2018-06-10: qty 100

## 2018-06-10 MED ORDER — DEXAMETHASONE SODIUM PHOSPHATE 10 MG/ML IJ SOLN
10.0000 mg | Freq: Three times a day (TID) | INTRAMUSCULAR | Status: DC
  Administered 2018-06-10 – 2018-06-11 (×3): 10 mg via INTRAVENOUS
  Filled 2018-06-10 (×3): qty 1

## 2018-06-10 MED ORDER — ALUM & MAG HYDROXIDE-SIMETH 200-200-20 MG/5ML PO SUSP: 30.0000 mL | ORAL | Status: DC | PRN

## 2018-06-10 MED ORDER — FENTANYL CITRATE (PF) 250 MCG/5ML IJ SOLN
INTRAMUSCULAR | Status: DC | PRN
  Administered 2018-06-10: 50 ug via INTRAVENOUS

## 2018-06-10 MED ORDER — FENTANYL CITRATE (PF) 250 MCG/5ML IJ SOLN
INTRAMUSCULAR | Status: AC
  Filled 2018-06-10: qty 5

## 2018-06-10 MED ORDER — MEPERIDINE HCL 50 MG/ML IJ SOLN: 6.2500 mg | INTRAMUSCULAR | Status: DC | PRN

## 2018-06-10 MED ORDER — POLYETHYLENE GLYCOL 3350 17 G PO PACK
17.0000 g | PACK | Freq: Two times a day (BID) | ORAL | Status: DC
  Administered 2018-06-10 – 2018-06-11 (×2): 17 g via ORAL
  Filled 2018-06-10 (×2): qty 1

## 2018-06-10 MED ORDER — LACTATED RINGERS IV SOLN
INTRAVENOUS | Status: DC
  Administered 2018-06-10 (×2): via INTRAVENOUS

## 2018-06-10 MED ORDER — GABAPENTIN 300 MG PO CAPS
300.0000 mg | ORAL_CAPSULE | Freq: Every day | ORAL | Status: DC
  Administered 2018-06-10: 300 mg via ORAL
  Filled 2018-06-10: qty 1

## 2018-06-10 MED ORDER — METOCLOPRAMIDE HCL 5 MG/ML IJ SOLN: 5.0000 mg | Freq: Three times a day (TID) | INTRAMUSCULAR | Status: DC | PRN

## 2018-06-10 MED ORDER — ASPIRIN EC 325 MG PO TBEC
325.0000 mg | DELAYED_RELEASE_TABLET | Freq: Every day | ORAL | Status: DC
  Administered 2018-06-11: 325 mg via ORAL
  Filled 2018-06-10: qty 1

## 2018-06-10 MED ORDER — DOCUSATE SODIUM 100 MG PO CAPS
100.0000 mg | ORAL_CAPSULE | Freq: Two times a day (BID) | ORAL | Status: DC
  Administered 2018-06-10 – 2018-06-11 (×3): 100 mg via ORAL
  Filled 2018-06-10 (×3): qty 1

## 2018-06-10 MED ORDER — ONDANSETRON HCL 4 MG/2ML IJ SOLN
INTRAMUSCULAR | Status: AC
  Filled 2018-06-10: qty 2

## 2018-06-10 MED ORDER — DIPHENHYDRAMINE HCL 12.5 MG/5ML PO ELIX: 12.5000 mg | ORAL_SOLUTION | ORAL | Status: DC | PRN

## 2018-06-10 MED ORDER — PHENOL 1.4 % MT LIQD: 1.0000 | OROMUCOSAL | Status: DC | PRN

## 2018-06-10 MED ORDER — MIDAZOLAM HCL 2 MG/2ML IJ SOLN
INTRAMUSCULAR | Status: AC
  Filled 2018-06-10: qty 2

## 2018-06-10 MED ORDER — MENTHOL 3 MG MT LOZG: 1.0000 | LOZENGE | OROMUCOSAL | Status: DC | PRN

## 2018-06-10 MED ORDER — SODIUM CHLORIDE 0.9 % IV SOLN
INTRAVENOUS | Status: DC | PRN
  Administered 2018-06-10: 30 ug/min via INTRAVENOUS

## 2018-06-10 MED ORDER — BUPIVACAINE-EPINEPHRINE (PF) 0.25% -1:200000 IJ SOLN
INTRAMUSCULAR | Status: AC
  Filled 2018-06-10: qty 30

## 2018-06-10 MED ORDER — SODIUM CHLORIDE 0.9 % IR SOLN
Status: DC | PRN
  Administered 2018-06-10: 3000 mL

## 2018-06-10 MED ORDER — HYDROMORPHONE HCL 1 MG/ML IJ SOLN
0.5000 mg | INTRAMUSCULAR | Status: DC | PRN
  Administered 2018-06-10 – 2018-06-11 (×2): 1 mg via INTRAVENOUS
  Filled 2018-06-10 (×2): qty 1

## 2018-06-10 MED ORDER — ONDANSETRON HCL 4 MG/2ML IJ SOLN
INTRAMUSCULAR | Status: DC | PRN
  Administered 2018-06-10: 4 mg via INTRAVENOUS

## 2018-06-10 SURGICAL SUPPLY — 81 items
ATTUNE MED DOME PAT 32 KNEE (Knees) ×1 IMPLANT
ATTUNE MED DOME PAT 32MM KNEE (Knees) ×1 IMPLANT
ATTUNE PS FEM RT SZ 4 CEM KNEE (Femur) ×2 IMPLANT
ATTUNE PSRP INSR SZ4 6 KNEE (Insert) ×1 IMPLANT
ATTUNE PSRP INSR SZ4 6MM KNEE (Insert) ×1 IMPLANT
BANDAGE ELASTIC 6 VELCRO ST LF (GAUZE/BANDAGES/DRESSINGS) ×2 IMPLANT
BANDAGE ESMARK 6X9 LF (GAUZE/BANDAGES/DRESSINGS) ×1 IMPLANT
BASEPLATE TIBIAL ROTATING SZ 4 (Knees) ×2 IMPLANT
BENZOIN TINCTURE PRP APPL 2/3 (GAUZE/BANDAGES/DRESSINGS) ×3 IMPLANT
BLADE SAGITTAL 25.0X1.19X90 (BLADE) ×2 IMPLANT
BLADE SAGITTAL 25.0X1.19X90MM (BLADE) ×1
BLADE SAW SGTL 13X75X1.27 (BLADE) ×3 IMPLANT
BLADE SURG 10 STRL SS (BLADE) ×6 IMPLANT
BNDG ELASTIC 6X15 VLCR STRL LF (GAUZE/BANDAGES/DRESSINGS) ×3 IMPLANT
BNDG ESMARK 6X9 LF (GAUZE/BANDAGES/DRESSINGS) ×3
BOWL SMART MIX CTS (DISPOSABLE) ×3 IMPLANT
CEMENT HV SMART SET (Cement) ×6 IMPLANT
CLOSURE STERI-STRIP 1/2X4 (GAUZE/BANDAGES/DRESSINGS) ×1
CLOSURE WOUND 1/2 X4 (GAUZE/BANDAGES/DRESSINGS) ×1
CLSR STERI-STRIP ANTIMIC 1/2X4 (GAUZE/BANDAGES/DRESSINGS) ×1 IMPLANT
COVER SURGICAL LIGHT HANDLE (MISCELLANEOUS) ×3 IMPLANT
COVER WAND RF STERILE (DRAPES) ×3 IMPLANT
CUFF TOURNIQUET SINGLE 34IN LL (TOURNIQUET CUFF) ×3 IMPLANT
CUFF TOURNIQUET SINGLE 44IN (TOURNIQUET CUFF) IMPLANT
DECANTER SPIKE VIAL GLASS SM (MISCELLANEOUS) ×3 IMPLANT
DRAPE EXTREMITY T 121X128X90 (DISPOSABLE) ×3 IMPLANT
DRAPE HALF SHEET 40X57 (DRAPES) ×6 IMPLANT
DRAPE INCISE IOBAN 66X45 STRL (DRAPES) IMPLANT
DRAPE ORTHO SPLIT 77X108 STRL (DRAPES) ×2
DRAPE SURG ORHT 6 SPLT 77X108 (DRAPES) ×1 IMPLANT
DRAPE U-SHAPE 47X51 STRL (DRAPES) ×3 IMPLANT
DRESSING AQUACEL AG SP 3.5X10 (GAUZE/BANDAGES/DRESSINGS) IMPLANT
DRSG AQUACEL AG ADV 3.5X10 (GAUZE/BANDAGES/DRESSINGS) ×3 IMPLANT
DRSG AQUACEL AG SP 3.5X10 (GAUZE/BANDAGES/DRESSINGS) ×3
DURAPREP 26ML APPLICATOR (WOUND CARE) ×3 IMPLANT
ELECT CAUTERY BLADE 6.4 (BLADE) ×3 IMPLANT
ELECT REM PT RETURN 9FT ADLT (ELECTROSURGICAL) ×3
ELECTRODE REM PT RTRN 9FT ADLT (ELECTROSURGICAL) ×1 IMPLANT
FACESHIELD WRAPAROUND (MASK) ×3 IMPLANT
FACESHIELD WRAPAROUND OR TEAM (MASK) ×1 IMPLANT
GLOVE BIO SURGEON STRL SZ7 (GLOVE) ×3 IMPLANT
GLOVE BIOGEL PI IND STRL 7.0 (GLOVE) ×1 IMPLANT
GLOVE BIOGEL PI IND STRL 7.5 (GLOVE) ×1 IMPLANT
GLOVE BIOGEL PI INDICATOR 7.0 (GLOVE) ×2
GLOVE BIOGEL PI INDICATOR 7.5 (GLOVE) ×2
GLOVE SS BIOGEL STRL SZ 7.5 (GLOVE) ×1 IMPLANT
GLOVE SUPERSENSE BIOGEL SZ 7.5 (GLOVE) ×2
GOWN STRL REUS W/ TWL LRG LVL3 (GOWN DISPOSABLE) ×1 IMPLANT
GOWN STRL REUS W/ TWL XL LVL3 (GOWN DISPOSABLE) ×1 IMPLANT
GOWN STRL REUS W/TWL LRG LVL3 (GOWN DISPOSABLE) ×2
GOWN STRL REUS W/TWL XL LVL3 (GOWN DISPOSABLE) ×2
HANDPIECE INTERPULSE COAX TIP (DISPOSABLE) ×2
HOOD PEEL AWAY FACE SHEILD DIS (HOOD) ×6 IMPLANT
IMMOBILIZER KNEE 22 UNIV (SOFTGOODS) ×3 IMPLANT
KIT BASIN OR (CUSTOM PROCEDURE TRAY) ×3 IMPLANT
KIT TURNOVER KIT B (KITS) ×3 IMPLANT
MANIFOLD NEPTUNE II (INSTRUMENTS) ×3 IMPLANT
MARKER SKIN DUAL TIP RULER LAB (MISCELLANEOUS) ×3 IMPLANT
NEEDLE HYPO 22GX1.5 SAFETY (NEEDLE) ×6 IMPLANT
NS IRRIG 1000ML POUR BTL (IV SOLUTION) ×3 IMPLANT
PACK TOTAL JOINT (CUSTOM PROCEDURE TRAY) ×3 IMPLANT
PAD ARMBOARD 7.5X6 YLW CONV (MISCELLANEOUS) ×6 IMPLANT
PIN STEINMAN FIXATION KNEE (PIN) ×2 IMPLANT
PIN THREADED HEADED SIGMA (PIN) ×2 IMPLANT
SET HNDPC FAN SPRY TIP SCT (DISPOSABLE) ×1 IMPLANT
STRIP CLOSURE SKIN 1/2X4 (GAUZE/BANDAGES/DRESSINGS) ×2 IMPLANT
SUCTION FRAZIER HANDLE 10FR (MISCELLANEOUS) ×2
SUCTION TUBE FRAZIER 10FR DISP (MISCELLANEOUS) ×1 IMPLANT
SUT MNCRL AB 3-0 PS2 18 (SUTURE) ×3 IMPLANT
SUT VIC AB 0 CT1 27 (SUTURE) ×4
SUT VIC AB 0 CT1 27XBRD ANBCTR (SUTURE) ×2 IMPLANT
SUT VIC AB 1 CT1 27 (SUTURE) ×2
SUT VIC AB 1 CT1 27XBRD ANBCTR (SUTURE) ×1 IMPLANT
SUT VIC AB 2-0 CT1 27 (SUTURE) ×4
SUT VIC AB 2-0 CT1 TAPERPNT 27 (SUTURE) ×2 IMPLANT
SYR CONTROL 10ML LL (SYRINGE) ×6 IMPLANT
TOWEL OR 17X24 6PK STRL BLUE (TOWEL DISPOSABLE) ×3 IMPLANT
TOWEL OR 17X26 10 PK STRL BLUE (TOWEL DISPOSABLE) ×3 IMPLANT
TRAY CATH 16FR W/PLASTIC CATH (SET/KITS/TRAYS/PACK) IMPLANT
TRAY FOLEY CATH SILVER 16FR (SET/KITS/TRAYS/PACK) ×3 IMPLANT
WATER STERILE IRR 1000ML POUR (IV SOLUTION) ×3 IMPLANT

## 2018-06-10 NOTE — Plan of Care (Signed)

## 2018-06-10 NOTE — Transfer of Care (Signed)
Immediate Anesthesia Transfer of Care Note  Patient: Mary Daniel  Procedure(s) Performed: TOTAL KNEE ARTHROPLASTY (Right Knee)  Patient Location: PACU  Anesthesia Type:Spinal and MAC combined with regional for post-op pain  Level of Consciousness: drowsy and responds to stimulation  Airway & Oxygen Therapy: Patient Spontanous Breathing and Patient connected to nasal cannula oxygen  Post-op Assessment: Report given to RN and Post -op Vital signs reviewed and stable  Post vital signs: Reviewed and stable  Last Vitals:  Vitals Value Taken Time  BP 105/57 06/10/2018  9:22 AM  Temp    Pulse 73 06/10/2018  9:24 AM  Resp 11 06/10/2018  9:24 AM  SpO2 94 % 06/10/2018  9:24 AM  Vitals shown include unvalidated device data.  Last Pain:  Vitals:   06/10/18 0609  TempSrc:   PainSc: 0-No pain      Patients Stated Pain Goal: 4 (62/37/62 8315)  Complications: No apparent anesthesia complications

## 2018-06-10 NOTE — Anesthesia Procedure Notes (Signed)
Anesthesia Regional Block: Adductor canal block   Pre-Anesthetic Checklist: ,, timeout performed, Correct Patient, Correct Site, Correct Laterality, Correct Procedure, Correct Position, site marked, Risks and benefits discussed,  Surgical consent,  Pre-op evaluation,  At surgeon's request and post-op pain management  Laterality: Right  Prep: chloraprep       Needles:  Injection technique: Single-shot  Needle Type: Echogenic Stimulator Needle     Needle Length: 5cm  Needle Gauge: 22     Additional Needles:   Procedures:, nerve stimulator,,, ultrasound used (permanent image in chart),,,,  Narrative:  Start time: 06/10/2018 7:05 AM End time: 06/10/2018 7:08 AM Injection made incrementally with aspirations every 5 mL.  Performed by: Personally  Anesthesiologist: Janeece Riggers, MD  Additional Notes: Functioning IV was confirmed and monitors were applied.  A 69mm 22ga Arrow echogenic stimulator needle was used. Sterile prep and drape,hand hygiene and sterile gloves were used. Ultrasound guidance: relevant anatomy identified, needle position confirmed, local anesthetic spread visualized around nerve(s)., vascular puncture avoided.  Image printed for medical record. Negative aspiration and negative test dose prior to incremental administration of local anesthetic. The patient tolerated the procedure well.

## 2018-06-10 NOTE — Interval H&P Note (Signed)
History and Physical Interval Note:  06/10/2018 6:45 AM  Mary Daniel  has presented today for surgery, with the diagnosis of djd right knee  The various methods of treatment have been discussed with the patient and family. After consideration of risks, benefits and other options for treatment, the patient has consented to  Procedure(s): TOTAL KNEE ARTHROPLASTY (Right) as a surgical intervention .  The patient's history has been reviewed, patient examined, no change in status, stable for surgery.  I have reviewed the patient's chart and labs.  Questions were answered to the patient's satisfaction.     Lorn Junes

## 2018-06-10 NOTE — Anesthesia Postprocedure Evaluation (Signed)
Anesthesia Post Note  Patient: Mary Daniel  Procedure(s) Performed: TOTAL KNEE ARTHROPLASTY (Right Knee)     Patient location during evaluation: PACU Anesthesia Type: MAC Level of consciousness: awake and alert Pain management: pain level controlled Vital Signs Assessment: post-procedure vital signs reviewed and stable Respiratory status: spontaneous breathing, nonlabored ventilation, respiratory function stable and patient connected to nasal cannula oxygen Cardiovascular status: stable and blood pressure returned to baseline Postop Assessment: no apparent nausea or vomiting Anesthetic complications: no    Last Vitals:  Vitals:   06/10/18 1430 06/10/18 1514  BP: 122/64 103/60  Pulse:  69  Resp:    Temp:  36.4 C  SpO2:  98%    Last Pain:  Vitals:   06/10/18 1530  TempSrc:   PainSc: 7                  Alenna Russell

## 2018-06-10 NOTE — Anesthesia Procedure Notes (Signed)
Procedure Name: MAC Date/Time: 06/10/2018 7:22 AM Performed by: Myna Bright, CRNA Pre-anesthesia Checklist: Patient identified, Emergency Drugs available, Suction available and Patient being monitored Patient Re-evaluated:Patient Re-evaluated prior to induction Oxygen Delivery Method: Nasal cannula Placement Confirmation: positive ETCO2 and breath sounds checked- equal and bilateral

## 2018-06-10 NOTE — Anesthesia Procedure Notes (Signed)
Spinal  Patient location during procedure: OR Start time: 06/10/2018 7:20 AM End time: 06/10/2018 7:25 AM Staffing Anesthesiologist: Janeece Riggers, MD Preanesthetic Checklist Completed: patient identified, site marked, surgical consent, pre-op evaluation, timeout performed, IV checked, risks and benefits discussed and monitors and equipment checked Spinal Block Patient position: sitting Prep: DuraPrep Patient monitoring: heart rate, cardiac monitor, continuous pulse ox and blood pressure Approach: midline Location: L3-4 Injection technique: single-shot Needle Needle type: Sprotte  Needle gauge: 24 G Needle length: 9 cm Assessment Sensory level: T4

## 2018-06-10 NOTE — Evaluation (Signed)
Physical Therapy Evaluation Patient Details Name: Mary Daniel MRN: [id number] DOB: 06/10/1950 Today's Date: 06/10/2018   History of Present Illness  68 y.o. female admitted on 06/10/18 for elective R TKA (WBAT post op).  Pt with significant PMH of arthritis and diverticulosis.    Clinical Impression  Pt is POD #0 and was able to stand and pivot OOB to the recliner chair with one person assist and RW. She was unable to do any gait due to significant buckling over her right leg with WB this evening.   PT to follow acutely for deficits listed below.    Follow Up Recommendations Home health PT;Supervision for mobility/OOB    Equipment Recommendations  Rolling walker with 5" wheels    Recommendations for Other Services   NA     Precautions / Restrictions Precautions Precautions: Knee Precaution Booklet Issued: Yes (comment) Precaution Comments: knee exercises given and WB precautions reviewed, no pillow rule reviewed. Restrictions Weight Bearing Restrictions: Yes RLE Weight Bearing: Weight bearing as tolerated      Mobility  Bed Mobility Overal bed mobility: Needs Assistance Bed Mobility: Supine to Sit     Supine to sit: Min assist;HOB elevated     General bed mobility comments: Min assist to help progress right leg to EOB.  Pt controlling her trunk and other leg with use of HOB elevated and rail.   Transfers Overall transfer level: Needs assistance Equipment used: Rolling walker (2 wheeled) Transfers: Sit to/from Stand Sit to Stand: Mod assist         General transfer comment: Mod assist to stand from bed to RW due to R LE buckling and weakness.  Pt able to support her trunk once both hands were up to the walker, but was unable to take good pivotal steps around to the chair (again due to significant buckling over that right knee).  She was able to pivot on her left leg using the RW for support around to the recliner chair.   Ambulation/Gait             General  Gait Details: unable to safely due to R LE buckling in WB.          Balance Overall balance assessment: Needs assistance Sitting-balance support: Feet supported;No upper extremity supported Sitting balance-Leahy Scale: Good     Standing balance support: Bilateral upper extremity supported Standing balance-Leahy Scale: Poor Standing balance comment: needs both UE supported on RW for standing.                              Pertinent Vitals/Pain Pain Assessment: Faces Faces Pain Scale: Hurts whole lot Pain Location: right knee Pain Descriptors / Indicators: Guarding;Grimacing Pain Intervention(s): Limited activity within patient's tolerance;Monitored during session;Repositioned;RN gave pain meds during session    Lucas expects to be discharged to:: Private residence Living Arrangements: Spouse/significant other Available Help at Discharge: Family;Available 24 hours/day Type of Home: House Home Access: Stairs to enter Entrance Stairs-Rails: None Entrance Stairs-Number of Steps: 2 Home Layout: One level Home Equipment: Cane - single point      Prior Function Level of Independence: Independent with assistive device(s)         Comments: had to start using a cane for gait at discharge     Hand Dominance   Dominant Hand: Right    Extremity/Trunk Assessment   Upper Extremity Assessment Upper Extremity Assessment: Overall WFL for tasks assessed  Lower Extremity Assessment Lower Extremity Assessment: RLE deficits/detail RLE Deficits / Details: right leg with normal post op pain and weakness, ankle at least 3/5, knee 3-/5, hip flexion 2+/5    Cervical / Trunk Assessment Cervical / Trunk Assessment: Normal  Communication   Communication: No difficulties  Cognition Arousal/Alertness: Awake/alert Behavior During Therapy: WFL for tasks assessed/performed Overall Cognitive Status: Within Functional Limits for tasks assessed                                            Exercises Total Joint Exercises Ankle Circles/Pumps: AROM;Both;20 reps Quad Sets: AROM;Right;10 reps Towel Squeeze: AROM;Both;10 reps Heel Slides: AAROM;Right;10 reps   Assessment/Plan    PT Assessment Patient needs continued PT services  PT Problem List Decreased strength;Decreased range of motion;Decreased activity tolerance;Decreased balance;Decreased mobility;Decreased knowledge of use of DME;Pain;Decreased knowledge of precautions       PT Treatment Interventions DME instruction;Stair training;Functional mobility training;Gait training;Therapeutic activities;Therapeutic exercise;Balance training;Patient/family education;Modalities;Manual techniques    PT Goals (Current goals can be found in the Care Plan section)  Acute Rehab PT Goals Patient Stated Goal: pain free walking PT Goal Formulation: With patient/family Time For Goal Achievement: 06/17/18 Potential to Achieve Goals: Good    Frequency 7X/week           AM-PAC PT "6 Clicks" Mobility  Outcome Measure Help needed turning from your back to your side while in a flat bed without using bedrails?: A Little Help needed moving from lying on your back to sitting on the side of a flat bed without using bedrails?: A Little Help needed moving to and from a bed to a chair (including a wheelchair)?: A Little Help needed standing up from a chair using your arms (e.g., wheelchair or bedside chair)?: A Little Help needed to walk in hospital room?: A Little Help needed climbing 3-5 steps with a railing? : A Little 6 Click Score: 18    End of Session Equipment Utilized During Treatment: Gait belt Activity Tolerance: Patient limited by pain Patient left: with call bell/phone within reach;in chair;with family/visitor present Nurse Communication: Mobility status PT Visit Diagnosis: Muscle weakness (generalized) (M62.81);Difficulty in walking, not elsewhere classified  (R26.2);Pain Pain - Right/Left: Right Pain - part of body: Knee    Time: 1533-1638(not charged the full amount due to chatting.) PT Time Calculation (min) (ACUTE ONLY): 65 min   Charges:           Wells Guiles B. Bethanny Toelle, PT, DPT  Acute Rehabilitation [phone] pager #[phone] office   PT Evaluation $PT Eval Moderate Complexity: 1 Mod PT Treatments $Therapeutic Exercise: 8-22 mins $Therapeutic Activity: 8-22 mins        06/10/2018, 6:11 PM

## 2018-06-10 NOTE — Op Note (Signed)
MRN:     [id number] DOB/AGE:    March 01, 1951 / 68 y.o.       OPERATIVE REPORT   DATE OF PROCEDURE:  06/10/2018      PREOPERATIVE DIAGNOSIS:   Primary Localized Osteoarthritis right Knee       Estimated body mass index is 26.29 kg/m as calculated from the following:   Height as of this encounter: 5\' 9"  (1.753 m).   Weight as of this encounter: 80.7 kg.                                                       POSTOPERATIVE DIAGNOSIS:   Same                                                                 PROCEDURE:  Procedure(s): TOTAL KNEE ARTHROPLASTY Using Depuy Attune RP implants #4 Femur, #4Tibia, 53mm  RP bearing, 32 Patella    SURGEON: Dolph Tavano A. Noemi Chapel, MD   ASSISTANT: Matthew Saras, PA-C, present and scrubbed throughout the case, critical for retraction, instrumentation, and closure.  ANESTHESIA: Spinal with Adductor Nerve Block  TOURNIQUET TIME: 60 minutes   COMPLICATIONS:  None       SPECIMENS: None   INDICATIONS FOR PROCEDURE: The patient has djd of the knee with varus deformities, XR shows bone on bone arthritis. Patient has failed all conservative measures including anti-inflammatory medicines, narcotics, attempts at exercise and weight loss, cortisone injections and viscosupplementation.  Risks and benefits of surgery have been discussed, questions answered.    DESCRIPTION OF PROCEDURE: The patient identified by armband, received right adductor canal block and IV antibiotics, in the holding area at [facility]. Patient taken to the operating room, appropriate anesthetic monitors were attached. Spinal anesthesia induced with the patient in supine position, Foley catheter was inserted. Tourniquet applied high to the operative thigh. Lateral post and foot positioner applied to the table, the lower extremity was then prepped and draped in usual sterile fashion from the ankle to the tourniquet. Time-out procedure was performed. The limb was wrapped with an Esmarch bandage  and the tourniquet inflated to 365 mmHg.   We began the operation by making a 6cm anterior midline incision. Small bleeders in the skin and the subcutaneous tissue identified and cauterized. Transverse retinaculum was incised and reflected medially and a medial parapatellar arthrotomy was accomplished. the patella was everted and theprepatellar fat pad resected. The superficial medial collateral ligament was then elevated from anterior to posterior along the proximal flare of the tibia and anterior half of the menisci resected. The knee was hyperflexed exposing bone on bone arthritis. Peripheral and notch osteophytes as well as the cruciate ligaments were then resected. We continued to work our way around posteriorly along the proximal tibia, and externally rotated the tibia subluxing it out from underneath the femur. A McHale retractor was placed through the notch and a lateral Hohmann retractor placed, and an external tibial guide was placed.  The tibial cutting guide was pinned into place allowing resection of 4 mm of bone medially and about 6 mm of bone laterally because of her  varus deformity.   Satisfied with the tibial resection, we then entered the distal femur 2 mm anterior to the PCL origin with the intramedullary guide rod and applied the distal femoral cutting guide set at 68mm, with 5 degrees of valgus. This was pinned along the epicondylar axis. At this point, the distal femoral cut was accomplished without difficulty. We then sized for a 4 femoral component and pinned the guide in 3 degrees of external rotation.The chamfer cutting guide was pinned into place. The anterior, posterior, and chamfer cuts were accomplished without difficulty followed by the  RP box cutting guide and the box cut. We also removed posterior osteophytes from the posterior femoral condyles. At this time, the knee was brought into full extension. We checked our extension and flexion gaps and found them symmetric at 6.  The  patella thickness measured at 15m m. We set the cutting guide at 15 and removed the posterior patella sized for 32 button and drilled the lollipop. The knee was then once again hyperflexed exposing the proximal tibia. We sized for a # 4 tibial base plate, applied the smokestack and the conical reamer followed by the the Delta fin keel punch. We then hammered into place the  RP trial femoral component, inserted a trial bearing, trial patellar button, and took the knee through range of motion from 0-130 degrees. No thumb pressure was required for patellar tracking.   At this point, all trial components were removed, a double batch of DePuy HV cement with Tobramycin 1.2gms was mixed and applied to all bony metallic mating surfaces. In order, we hammered into place the tibial tray and removed excess cement, the femoral component and removed excess cement, a 6 mm  RP bearing was inserted, and the knee brought to full extension with compression. The patellar button was clamped into place, and excess cement removed. While the cement cured the wound was irrigated out with normal saline solution pulse lavage, and exparel was injected throughout the knee. Ligament stability and patellar tracking were checked and found to be excellent..   The parapatellar arthrotomy was closed with  #1 Vicryl suture. The subcutaneous tissue with 0 and 2-0 undyed Vicryl suture, and 4-0 Monocryl.. A dressing of Aquaseal, 4 x 4, dressing sponges, Webril, and Ace wrap applied. Needle and sponge count were correct times 2.The patient awakened, extubated, and taken to recovery room without difficulty. Vascular status was normal, pulses 2+ and symmetric.    Lorn Junes 08/27/2017, 8:56 AM

## 2018-06-10 NOTE — Care Plan (Signed)
Ortho Bundle Case Management Note  Patient Details  Name: ARIANE DITULLIO MRN: [id number] Date of Birth: [birth year]    Spoke with patient prior to surgery. Post acute care arranged with [facility] and equipment has been delivered.  Please contact me with questions.                  DME Arranged:  Walker rolling, CPM, Bedside commode DME Agency:  [employer]  HH Arranged:  PT [place] Agency:  [facility] (formerly [facility])  Additional Comments: Please contact me with any questions of if this plan should need to change.  Ladell Heads,  [place] [facility]ist  [phone] 06/10/2018, 8:16 AM

## 2018-06-10 NOTE — Progress Notes (Signed)
Orthopedic Tech Progress Note Patient Details:  RUTA CAPECE 21-Apr-1951 [id number]  CPM Right Knee CPM Right Knee: On Right Knee Flexion (Degrees): 90 Right Knee Extension (Degrees): 0 Additional Comments: foot roll  Post Interventions Patient Tolerated: Well Instructions Provided: Care of device  [place] Pink 06/10/2018, 10:01 AM

## 2018-06-11 ENCOUNTER — Other Ambulatory Visit: Payer: Self-pay

## 2018-06-11 ENCOUNTER — Encounter (HOSPITAL_COMMUNITY): Payer: Self-pay | Admitting: Orthopedic Surgery

## 2018-06-11 DIAGNOSIS — M2142 Flat foot [pes planus] (acquired), left foot: Secondary | ICD-10-CM | POA: Diagnosis not present

## 2018-06-11 DIAGNOSIS — M25761 Osteophyte, right knee: Secondary | ICD-10-CM | POA: Diagnosis not present

## 2018-06-11 DIAGNOSIS — Z96659 Presence of unspecified artificial knee joint: Secondary | ICD-10-CM

## 2018-06-11 DIAGNOSIS — M1711 Unilateral primary osteoarthritis, right knee: Secondary | ICD-10-CM | POA: Diagnosis not present

## 2018-06-11 DIAGNOSIS — M2141 Flat foot [pes planus] (acquired), right foot: Secondary | ICD-10-CM | POA: Diagnosis not present

## 2018-06-11 DIAGNOSIS — E669 Obesity, unspecified: Secondary | ICD-10-CM | POA: Diagnosis not present

## 2018-06-11 DIAGNOSIS — K219 Gastro-esophageal reflux disease without esophagitis: Secondary | ICD-10-CM | POA: Diagnosis not present

## 2018-06-11 LAB — CBC
HEMATOCRIT: 33.5 % — AB (ref 36.0–46.0)
Hemoglobin: 10.9 g/dL — ABNORMAL LOW (ref 12.0–15.0)
MCH: 30 pg (ref 26.0–34.0)
MCHC: 32.5 g/dL (ref 30.0–36.0)
MCV: 92.3 fL (ref 80.0–100.0)
Platelets: 203 10*3/uL (ref 150–400)
RBC: 3.63 MIL/uL — ABNORMAL LOW (ref 3.87–5.11)
RDW: 13.2 % (ref 11.5–15.5)
WBC: 10.2 10*3/uL (ref 4.0–10.5)
nRBC: 0 % (ref 0.0–0.2)

## 2018-06-11 LAB — BASIC METABOLIC PANEL
Anion gap: 9 (ref 5–15)
BUN: 8 mg/dL (ref 8–23)
CO2: 23 mmol/L (ref 22–32)
Calcium: 8.8 mg/dL — ABNORMAL LOW (ref 8.9–10.3)
Chloride: 105 mmol/L (ref 98–111)
Creatinine, Ser: 0.71 mg/dL (ref 0.44–1.00)
GFR calc Af Amer: >60 mL/min (ref >60)
GFR calc non Af Amer: >60 mL/min (ref >60)
GLUCOSE: 164 mg/dL — AB (ref 70–99)
POTASSIUM: 3.9 mmol/L (ref 3.5–5.1)
Sodium: 137 mmol/L (ref 135–145)

## 2018-06-11 MED ORDER — ASPIRIN 325 MG PO TBEC: DELAYED_RELEASE_TABLET | ORAL | 0 refills | Status: DC

## 2018-06-11 MED ORDER — POLYETHYLENE GLYCOL 3350 17 G PO PACK: PACK | ORAL | 0 refills | Status: DC

## 2018-06-11 MED ORDER — SODIUM CHLORIDE 0.9 % IV BOLUS
500.0000 mL | Freq: Once | INTRAVENOUS | Status: AC
  Administered 2018-06-11: 500 mL via INTRAVENOUS

## 2018-06-11 MED ORDER — GABAPENTIN 300 MG PO CAPS: 300.0000 mg | ORAL_CAPSULE | Freq: Every day | ORAL | 0 refills | Status: DC

## 2018-06-11 MED ORDER — OXYCODONE HCL 5 MG PO TABS: ORAL_TABLET | ORAL | 0 refills | Status: DC

## 2018-06-11 MED ORDER — DOCUSATE SODIUM 100 MG PO CAPS: ORAL_CAPSULE | ORAL | 0 refills | Status: DC

## 2018-06-11 NOTE — Progress Notes (Signed)
Subjective: 1 Day Post-Op Procedure(s) (LRB): TOTAL KNEE ARTHROPLASTY (Right) Patient reports pain as 5 on 0-10 scale.    Objective: Vital signs in last 24 hours: Temp:  [97.2 F (36.2 C)-98.3 F (36.8 C)] 97.5 F (36.4 C) (01/07 0443) Pulse Rate:  [65-83] 66 (01/07 0443) Resp:  [12-19] 16 (01/07 0036) BP: (97-139)/(57-80) 107/65 (01/07 0443) SpO2:  [93 %-99 %] 93 % (01/07 0443)  Intake/Output from previous day: 01/06 0701 - 01/07 0700 In: 2511.1 [P.O.:200; I.V.:2111.1; IV Piggyback:200] Out: 2300 [Urine:2250; Blood:50] Intake/Output this shift: No intake/output data recorded.  Recent Labs    06/11/18 0212  HGB 10.9*   Recent Labs    06/11/18 0212  WBC 10.2  RBC 3.63*  HCT 33.5*  PLT 203   Recent Labs    06/11/18 0212  NA 137  K 3.9  CL 105  CO2 23  BUN 8  CREATININE 0.71  GLUCOSE 164*  CALCIUM 8.8*   No results for input(s): LABPT, INR in the last 72 hours.  Neurologically intact ABD soft Sensation intact distally Intact pulses distally Dorsiflexion/Plantar flexion intact Incision: dressing C/D/I  Anticipated LOS equal to or greater than 2 midnights due to - Age 2 and older with one or more of the following:  - Obesity  - Expected need for hospital services (PT, OT, Nursing) required for safe  discharge  - Anticipated need for postoperative skilled nursing care or inpatient rehab  - Active co-morbidities: None OR   - Unanticipated findings during/Post Surgery: None  - Patient is a high risk of re-admission due to: None   Assessment/Plan: 1 Day Post-Op Procedure(s) (LRB): TOTAL KNEE ARTHROPLASTY (Right)  Principal Problem:   Primary localized osteoarthritis of right knee Active Problems:   Pes planus of both feet   Posterior tibialis tendon insufficiency   Abnormality of gait Some difficulty with nausea last night and this am.  Did well ambulating around the room with me today.  May be able to D/C this afternoon if progresses well with  physical therapy. Advance diet Up with therapy    Linda Hedges 06/11/2018, 8:40 AM

## 2018-06-11 NOTE — Discharge Summary (Signed)
Patient ID: Mary Daniel MRN: [id number] DOB/AGE: 1950-12-12 68 y.o.  Admit date: 06/10/2018 Discharge date: 06/11/2018  Admission Diagnoses:  Principal Problem:   Primary localized osteoarthritis of right knee Active Problems:   Pes planus of both feet   Posterior tibialis tendon insufficiency   Abnormality of gait   Discharge Diagnoses:  Same  Past Medical History:  Diagnosis Date  . Arthritis   . Diverticulosis   . GERD (gastroesophageal reflux disease)   . Headache    migraines  . PONV (postoperative nausea and vomiting)   . Primary localized osteoarthritis of knee    right  . Primary localized osteoarthritis of right knee 05/27/2018  . Wears glasses     Surgeries: Procedure(s): TOTAL KNEE ARTHROPLASTY on 06/10/2018   Consultants:   Discharged Condition: Improved  Hospital Course: DARCEY CARDY is an 68 y.o. female who was admitted 06/10/2018 for operative treatment ofPrimary localized osteoarthritis of right knee. Patient has severe unremitting pain that affects sleep, daily activities, and work/hobbies. After pre-op clearance the patient was taken to the operating room on 06/10/2018 and underwent  Procedure(s): TOTAL KNEE ARTHROPLASTY.    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  Dr Noemi Chapel would like the patient to get both Vancomycin and Ancef for a full 24 hrs post op   1,000 mg 200 mL/hr over 60 Minutes Intravenous Every 12 hours 06/10/18 1434 06/11/18 0655   06/10/18 1500  ceFAZolin (ANCEF) IVPB 2g/100 mL premix     2 g 200 mL/hr over 30 Minutes Intravenous Every 6 hours 06/10/18 1434 06/10/18 2214   06/10/18 0753  tobramycin (NEBCIN) powder  Status:  Discontinued       As needed 06/10/18 0753 06/10/18 0918   06/10/18 0700   ceFAZolin (ANCEF) IVPB 2g/100 mL premix     2 g 200 mL/hr over 30 Minutes Intravenous To ShortStay [facility] 06/07/18 1238 06/10/18 0729   06/10/18 0630  vancomycin (VANCOCIN) IVPB 1000 mg/200 mL premix     1,000 mg 200 mL/hr over 60 Minutes Intravenous To ShortStay Surgical 06/07/18 1242 06/10/18 1812       Patient was given sequential compression devices, early ambulation, and chemoprophylaxis to prevent DVT.  Patient benefited maximally from hospital stay and there were no complications.    Recent vital signs:  Patient Vitals for the past 24 hrs:  BP Temp Temp src Pulse Resp SpO2  06/11/18 1210 116/63 97.8 F (36.6 C) Oral 78 20 100 %  06/11/18 0443 107/65 (!) 97.5 F (36.4 C) Oral 66 - 93 %  06/11/18 0036 122/64 98 F (36.7 C) Oral 72 16 96 %  06/10/18 2057 114/80 98.3 F (36.8 C) Oral 78 16 97 %  06/10/18 1514 103/60 97.6 F (36.4 C) Oral 69 - 98 %  06/10/18 1430 122/64 - - - - -     Recent laboratory studies:  Recent Labs    06/11/18 0212  WBC 10.2  HGB 10.9*  HCT 33.5*  PLT 203  NA 137  K 3.9  CL 105  CO2 23  BUN 8  CREATININE 0.71  GLUCOSE 164*  CALCIUM 8.8*     Discharge Medications:   Allergies as of 06/11/2018   No Known Allergies     Medication  List    STOP taking these medications   diclofenac sodium 1 % Gel Commonly known as:  VOLTAREN   Estradiol 10 MCG Tabs vaginal tablet   nitrofurantoin (macrocrystal-monohydrate) 100 MG capsule Commonly known as:  MACROBID     TAKE these medications   acetaminophen 500 MG tablet Commonly known as:  TYLENOL Take 1,000 mg by mouth every 6 (six) hours as needed for moderate pain.   aspirin 325 MG EC tablet 1 tab a day for the next 30 days to prevent blood clots   docusate sodium 100 MG capsule Commonly known as:  COLACE 1 tab 2 times a day while on narcotics.  STOOL SOFTENER   gabapentin 300 MG capsule Commonly known as:  NEURONTIN Take 1 capsule (300 mg total) by mouth at bedtime.    oxyCODONE 5 MG immediate release tablet Commonly known as:  Oxy IR/ROXICODONE 1 po q 6 hrs prn pain   polyethylene glycol packet Commonly known as:  MIRALAX / GLYCOLAX 17grams in 6 oz of something to drink twice a day until bowel movement.  LAXITIVE.  Restart if two days since last bowel movement            Discharge Care Instructions  (From admission, onward)         Start     Ordered   06/11/18 0000  Change dressing    Comments:  DO NOT REMOVE BANDAGE OVER SURGICAL INCISION.  [place] WHOLE LEG INCLUDING OVER THE WATERPROOF BANDAGE WITH SOAP AND WATER EVERY DAY.   06/11/18 1405          Diagnostic Studies: Mm Digital Diagnostic Unilat L  Result Date: 05/15/2018 CLINICAL DATA:  Screening recall for left breast calcifications. EXAM: DIGITAL DIAGNOSTIC LEFT MAMMOGRAM COMPARISON:  Screening study, 05/07/2018 and prior exam, 10/31/2011. ACR Breast Density Category b: There are scattered areas of fibroglandular density. FINDINGS: There is a small cluster of calcifications in the superior left breast, coarse and mildly heterogeneous, numbering 5-6, spanning 4 x 3 x 2 mm. There's no associated mass or distortion. IMPRESSION: Probably benign left breast calcifications. Short-term follow-up recommended. RECOMMENDATION: Diagnostic left breast mammography with magnification views in 6 months. I have discussed the findings and recommendations with the patient. Results were also provided in writing at the conclusion of the visit. If applicable, a reminder letter will be sent to the patient regarding the next appointment. BI-RADS CATEGORY  3: Probably benign. Electronically Signed   By: Lajean Manes M.D.   On: 05/15/2018 16:13    Disposition: Discharge disposition: 01-Home or Self Care       Discharge Instructions    CPM   Complete by:  As directed    Continuous passive motion machine (CPM):      Use the CPM from 0 to 90 for 6 hours per day.       You may break it up into 2 or 3  sessions per day.      Use CPM for 2 weeks or until you are told to stop.   Call MD / Call 911   Complete by:  As directed    If you experience chest pain or shortness of breath, CALL 911 and be transported to the hospital emergency room.  If you develope a fever above 101 F, pus (white drainage) or increased drainage or redness at the wound, or calf pain, call your surgeon's office.   Change dressing   Complete by:  As directed    DO NOT REMOVE  BANDAGE OVER SURGICAL INCISION.  [place] WHOLE LEG INCLUDING OVER THE WATERPROOF BANDAGE WITH SOAP AND WATER EVERY DAY.   Constipation Prevention   Complete by:  As directed    Drink plenty of fluids.  Prune juice may be helpful.  You may use a stool softener, such as Colace (over the counter) 100 mg twice a day.  Use MiraLax (over the counter) for constipation as needed.   Diet - low sodium heart healthy   Complete by:  As directed    Discharge instructions   Complete by:  As directed    INSTRUCTIONS AFTER JOINT REPLACEMENT   Remove items at home which could result in a fall. This includes throw rugs or furniture in walking pathways ICE to the affected joint every three hours while awake for 30 minutes at a time, for at least the first 3-5 days, and then as needed for pain and swelling.  Continue to use ice for pain and swelling. You may notice swelling that will progress down to the foot and ankle.  This is normal after surgery.  Elevate your leg when you are not up walking on it.   Continue to use the breathing machine you got in the hospital (incentive spirometer) which will help keep your temperature down.  It is common for your temperature to cycle up and down following surgery, especially at night when you are not up moving around and exerting yourself.  The breathing machine keeps your lungs expanded and your temperature down.   DIET:  As you were doing prior to hospitalization, we recommend a well-balanced diet.  DRESSING / WOUND CARE /  SHOWERING  Keep the surgical dressing until follow up.  The dressing is water proof, so you can shower without any extra covering.  IF THE DRESSING FALLS OFF or the wound gets wet inside, change the dressing with sterile gauze.  Please use good hand washing techniques before changing the dressing.  Do not use any lotions or creams on the incision until instructed by your surgeon.    ACTIVITY  Increase activity slowly as tolerated, but follow the weight bearing instructions below.   No driving for 6 weeks or until further direction given by your physician.  You cannot drive while taking narcotics.  No lifting or carrying greater than 10 lbs. until further directed by your surgeon. Avoid periods of inactivity such as sitting longer than an hour when not asleep. This helps prevent blood clots.  You may return to work once you are authorized by your doctor.     WEIGHT BEARING   Weight bearing as tolerated with assist device (walker, cane, etc) as directed, use it as long as suggested by your surgeon or therapist, typically at least 2-3 weeks.   EXERCISES  Results after joint replacement surgery are often greatly improved when you follow the exercise, range of motion and muscle strengthening exercises prescribed by your doctor. Safety measures are also important to protect the joint from further injury. Any time any of these exercises cause you to have increased pain or swelling, decrease what you are doing until you are comfortable again and then slowly increase them. If you have problems or questions, call your caregiver or physical therapist for advice.   Rehabilitation is important following a joint replacement. After just a few days of immobilization, the muscles of the leg can become weakened and shrink (atrophy).  These exercises are designed to build up the tone and strength of the thigh and leg muscles  and to improve motion. Often times heat used for twenty to thirty minutes before working  out will loosen up your tissues and help with improving the range of motion but do not use heat for the first two weeks following surgery (sometimes heat can increase post-operative swelling).   These exercises can be done on a training (exercise) mat, on the floor, on a table or on a bed. Use whatever works the best and is most comfortable for you.    Use music or television while you are exercising so that the exercises are a pleasant break in your day. This will make your life better with the exercises acting as a break in your routine that you can look forward to.   Perform all exercises about fifteen times, three times per day or as directed.  You should exercise both the operative leg and the other leg as well.   Exercises include:  Quad Sets - Tighten up the muscle on the front of the thigh (Quad) and hold for 5-10 seconds.   Straight Leg Raises - With your knee straight (if you were given a brace, keep it on), lift the leg to 60 degrees, hold for 3 seconds, and slowly lower the leg.  Perform this exercise against resistance later as your leg gets stronger.  Leg Slides: Lying on your back, slowly slide your foot toward your buttocks, bending your knee up off the floor (only go as far as is comfortable). Then slowly slide your foot back down until your leg is flat on the floor again.  Angel Wings: Lying on your back spread your legs to the side as far apart as you can without causing discomfort.  Hamstring Strength:  Lying on your back, push your heel against the floor with your leg straight by tightening up the muscles of your buttocks.  Repeat, but this time bend your knee to a comfortable angle, and push your heel against the floor.  You may put a pillow under the heel to make it more comfortable if necessary.   A rehabilitation program following joint replacement surgery can speed recovery and prevent re-injury in the future due to weakened muscles. Contact your doctor or a physical therapist  for more information on knee rehabilitation.    CONSTIPATION  Constipation is defined medically as fewer than three stools per week and severe constipation as less than one stool per week.  Even if you have a regular bowel pattern at home, your normal regimen is likely to be disrupted due to multiple reasons following surgery.  Combination of anesthesia, postoperative narcotics, change in appetite and fluid intake all can affect your bowels.   YOU MUST use at least one of the following options; they are listed in order of increasing strength to get the job done.  They are all available over the counter, and you may need to use some, POSSIBLY even all of these options:    Drink plenty of fluids (prune juice may be helpful) and high fiber foods Colace 100 mg by mouth twice a day  Senokot for constipation as directed and as needed Dulcolax (bisacodyl), take with full glass of water  Miralax (polyethylene glycol) once or twice a day as needed.  If you have tried all these things and are unable to have a bowel movement in the first 3-4 days after surgery call either your surgeon or your primary doctor.    If you experience loose stools or diarrhea, hold the medications until you stool  forms back up.  If your symptoms do not get better within 1 week or if they get worse, check with your doctor.  If you experience "the worst abdominal pain ever" or develop nausea or vomiting, please contact the office immediately for further recommendations for treatment.   ITCHING:  If you experience itching with your medications, try taking only a single pain pill, or even half a pain pill at a time.  You can also use Benadryl over the counter for itching or also to help with sleep.   TED HOSE STOCKINGS:  Use stockings on both legs until for at least 2 weeks or as directed by physician office. They may be removed at night for sleeping.  MEDICATIONS:  See your medication summary on the "After Visit Summary" that  nursing will review with you.  You may have some home medications which will be placed on hold until you complete the course of blood thinner medication.  It is important for you to complete the blood thinner medication as prescribed.  PRECAUTIONS:  If you experience chest pain or shortness of breath - call 911 immediately for transfer to the hospital emergency department.   If you develop a fever greater that 101 F, purulent drainage from wound, increased redness or drainage from wound, foul odor from the wound/dressing, or calf pain - CONTACT YOUR SURGEON.                                                   FOLLOW-UP APPOINTMENTS:  If you do not already have a post-op appointment, please call the office for an appointment to be seen by your surgeon.  Guidelines for how soon to be seen are listed in your "After Visit Summary", but are typically between 1-4 weeks after surgery.  OTHER INSTRUCTIONS:   Knee Replacement:  Do not place pillow under knee, focus on keeping the knee straight while resting. CPM instructions: 0-90 degrees, 2 hours in the morning, 2 hours in the afternoon, and 2 hours in the evening. Place foam block, curve side up under heel at all times except when in CPM or when walking.  DO NOT modify, tear, cut, or change the foam block in any way.  MAKE SURE YOU:  Understand these instructions.  Get help right away if you are not doing well or get worse.    Thank you for letting us be a part of your medical care team.  It is a privilege we respect greatly.  We hope these instructions will help you stay on track for a fast and full recovery!   Do not put a pillow under the knee. Place it under the heel.   Complete by:  As directed    Place gray foam block, curve side up under heel at all times except when in CPM or when walking.  DO NOT modify, tear, cut, or change in any way the gray foam block.   Increase activity slowly as tolerated   Complete by:  As directed    Patient may  shower   Complete by:  As directed    Aquacel dressing is water proof    Wash over it and the whole leg with soap and water at the end of your shower   TED hose   Complete by:  As directed    Use stockings (TED  hose) for 2 weeks on both leg(s).  You may remove them at night for sleeping.      Follow-up Information    Elsie Saas, MD. Go on 06/24/2018.   Specialty:  Orthopedic Surgery Why:  Your appointment has been made for 2:30  Contact information: [place] [address] [phone]        [facility] Go on 06/24/2018.   Specialty:  Physical Therapy Why:  You are scheduled to start Outpatient Physical Therapy at 11:00. Please arrive a few minutes early to complete your paperwork. Contact information: [place] [address] [phone]        [facility] Follow up.   Specialty:  [place] Why:  HHPT will see you at home for 5 visits prior to your MD follow up  Contact information: [place] [address] [phone]            Signed: Linda Hedges 06/11/2018, 2:05 PM

## 2018-06-11 NOTE — Plan of Care (Signed)
Problem: Education: Goal: Knowledge of General Education information will improve Description Including pain rating scale, medication(s)/side effects and non-pharmacologic comfort measures Outcome: Progressing   Problem: Health Behavior/Discharge Planning: Goal: Ability to manage health-related needs will improve Outcome: Progressing   Problem: Activity: Goal: Risk for activity intolerance will decrease Outcome: Progressing   Problem: Nutrition: Goal: Adequate nutrition will be maintained Outcome: Progressing   Problem: Coping: Goal: Level of anxiety will decrease Outcome: Progressing   Problem: Elimination: Goal: Will not experience complications related to urinary retention Outcome: Progressing   Problem: Pain Managment: Goal: General experience of comfort will improve Outcome: Progressing   Problem: Safety: Goal: Ability to remain free from injury will improve Outcome: Progressing   Problem: Skin Integrity: Goal: Risk for impaired skin integrity will decrease Outcome: Progressing   

## 2018-06-11 NOTE — Care Management (Signed)
Patient is Ortho Bundled patient, [place] and DME arranged preoperatively by office case manager.

## 2018-06-11 NOTE — Progress Notes (Signed)
Physical Therapy Treatment Patient Details Name: Mary Daniel MRN: [id number] DOB: 23-Feb-1951 Today's Date: 06/11/2018    History of Present Illness 68 y.o. female admitted on 06/10/18 for elective R TKA (WBAT post op).  Pt with significant PMH of arthritis and diverticulosis.      PT Comments    Patient is making good progress with PT.  From a mobility standpoint anticipate patient will be ready for DC home when medically ready.    Follow Up Recommendations  Home health PT;Supervision for mobility/OOB     Equipment Recommendations  Rolling walker with 5" wheels    Recommendations for Other Services       Precautions / Restrictions Precautions Precautions: Knee Precaution Comments: positioning/precautions reviewed with pt Restrictions Weight Bearing Restrictions: Yes RLE Weight Bearing: Weight bearing as tolerated    Mobility  Bed Mobility Overal bed mobility: Modified Independent                Transfers Overall transfer level: Needs assistance Equipment used: Rolling walker (2 wheeled) Transfers: Sit to/from Stand Sit to Stand: Supervision            Ambulation/Gait Ambulation/Gait assistance: Min guard;Supervision Gait Distance (Feet): 100 Feet Assistive device: Rolling walker (2 wheeled) Gait Pattern/deviations: Step-through pattern;Decreased stride length     General Gait Details: cues for forward gaze; steady gait   Stairs Stairs: Yes Stairs assistance: Min assist Stair Management: No rails;Step to pattern;Backwards;With walker Number of Stairs: 3 General stair comments: cues for sequencing and technique; pt's husband stabilized RW    Wheelchair Mobility    Modified Rankin (Stroke Patients Only)       Balance Overall balance assessment: Needs assistance Sitting-balance support: Feet supported;No upper extremity supported Sitting balance-Leahy Scale: Good     Standing balance support: Bilateral upper extremity  supported Standing balance-Leahy Scale: Poor Standing balance comment: pt is able to static stand without UE support                             Cognition Arousal/Alertness: Awake/alert Behavior During Therapy: WFL for tasks assessed/performed Overall Cognitive Status: Within Functional Limits for tasks assessed                                        Exercises Total Joint Exercises Ankle Circles/Pumps: AROM;Both;10 reps Quad Sets: AROM;Right;Strengthening;10 reps;Supine Short Arc Quad: AROM;Strengthening;Right;10 reps;Supine Heel Slides: AROM;Strengthening;Right;10 reps;Supine Hip ABduction/ADduction: AROM;Strengthening;Right;10 reps;Supine Straight Leg Raises: AROM;Strengthening;Right;10 reps;Supine Long Arc Quad: AROM;Strengthening;Right;10 reps;Seated Knee Flexion: AROM;AAROM;Right;10 reps;Seated;Other (comment)(10 second holds )    General Comments        Pertinent Vitals/Pain Pain Assessment: Faces Faces Pain Scale: Hurts a little bit Pain Location: right knee Pain Descriptors / Indicators: Tightness;Discomfort Pain Intervention(s): Monitored during session;Repositioned    Home Living                      Prior Function            PT Goals (current goals can now be found in the care plan section) Acute Rehab PT Goals Patient Stated Goal: pain free walking Progress towards PT goals: Progressing toward goals    Frequency    7X/week      PT Plan Current plan remains appropriate    Co-evaluation  AM-PAC PT "6 Clicks" Mobility   Outcome Measure  Help needed turning from your back to your side while in a flat bed without using bedrails?: A Little Help needed moving from lying on your back to sitting on the side of a flat bed without using bedrails?: A Little Help needed moving to and from a bed to a chair (including a wheelchair)?: A Little Help needed standing up from a chair using your arms (e.g.,  wheelchair or bedside chair)?: A Little Help needed to walk in hospital room?: A Little Help needed climbing 3-5 steps with a railing? : A Little 6 Click Score: 18    End of Session Equipment Utilized During Treatment: Gait belt Activity Tolerance: Patient tolerated treatment well Patient left: with call bell/phone within reach;in chair;with family/visitor present;Other (comment)(R LE in zero degree foam) Nurse Communication: Mobility status PT Visit Diagnosis: Muscle weakness (generalized) (M62.81);Difficulty in walking, not elsewhere classified (R26.2);Pain Pain - Right/Left: Right Pain - part of body: Knee     Time: 3291-9166 PT Time Calculation (min) (ACUTE ONLY): 32 min  Charges:  $Gait Training: 8-22 mins $Therapeutic Exercise: 8-22 mins                     Earney Navy, PTA Acute Rehabilitation Services Pager: [phone] Office: [phone]     Darliss Cheney 06/11/2018, 3:17 PM

## 2018-06-11 NOTE — Progress Notes (Signed)
Physical Therapy Treatment Patient Details Name: Mary Daniel MRN: [id number] DOB: January 20, 1951 Today's Date: 06/11/2018    History of Present Illness 68 y.o. female admitted on 06/10/18 for elective R TKA (WBAT post op).  Pt with significant PMH of arthritis and diverticulosis.      PT Comments    Patient seen for mobility progression. Pt is making good progress toward PT goals and tolerated gait and stair training well. Review HEP next session. Current plan remains appropriate.    Follow Up Recommendations  Home health PT;Supervision for mobility/OOB     Equipment Recommendations  Rolling walker with 5" wheels    Recommendations for Other Services       Precautions / Restrictions Precautions Precautions: Knee Precaution Comments: positioning/precautions reviewed with pt Restrictions Weight Bearing Restrictions: Yes RLE Weight Bearing: Weight bearing as tolerated    Mobility  Bed Mobility               General bed mobility comments: pt OOB in chair upon arrival  Transfers Overall transfer level: Needs assistance Equipment used: Rolling walker (2 wheeled) Transfers: Sit to/from Stand Sit to Stand: Min guard         General transfer comment: min guard for safety; safe hand placement demonstrated  Ambulation/Gait Ambulation/Gait assistance: Min guard;Supervision Gait Distance (Feet): 170 Feet Assistive device: Rolling walker (2 wheeled) Gait Pattern/deviations: Step-through pattern     General Gait Details: cues for posture, sequencing, and R heel strike/toe off; pt with improving step length symmetry   Stairs Stairs: Yes Stairs assistance: Min assist Stair Management: No rails;Step to pattern;Backwards;With walker Number of Stairs: 3 General stair comments: cues for sequencing and technique; assist to stabilize RW    Wheelchair Mobility    Modified Rankin (Stroke Patients Only)       Balance Overall balance assessment: Needs  assistance Sitting-balance support: Feet supported;No upper extremity supported Sitting balance-Leahy Scale: Good     Standing balance support: Bilateral upper extremity supported Standing balance-Leahy Scale: Poor Standing balance comment: pt is able to static stand without UE support                             Cognition Arousal/Alertness: Awake/alert Behavior During Therapy: WFL for tasks assessed/performed Overall Cognitive Status: Within Functional Limits for tasks assessed                                        Exercises      General Comments        Pertinent Vitals/Pain Pain Assessment: Faces Faces Pain Scale: Hurts little more Pain Location: right knee Pain Descriptors / Indicators: Guarding;Sore;Tightness Pain Intervention(s): Limited activity within patient's tolerance;Monitored during session;Repositioned;Premedicated before session    Home Living                      Prior Function            PT Goals (current goals can now be found in the care plan section) Acute Rehab PT Goals Patient Stated Goal: pain free walking Progress towards PT goals: Progressing toward goals    Frequency    7X/week      PT Plan Current plan remains appropriate    Co-evaluation              AM-PAC PT "6 Clicks" Mobility   Outcome  Measure  Help needed turning from your back to your side while in a flat bed without using bedrails?: A Little Help needed moving from lying on your back to sitting on the side of a flat bed without using bedrails?: A Little Help needed moving to and from a bed to a chair (including a wheelchair)?: A Little Help needed standing up from a chair using your arms (e.g., wheelchair or bedside chair)?: A Little Help needed to walk in hospital room?: A Little Help needed climbing 3-5 steps with a railing? : A Little 6 Click Score: 18    End of Session Equipment Utilized During Treatment: Gait  belt Activity Tolerance: Patient tolerated treatment well Patient left: with call bell/phone within reach;in chair;with family/visitor present;Other (comment)(R LE in zero degree foam) Nurse Communication: Mobility status PT Visit Diagnosis: Muscle weakness (generalized) (M62.81);Difficulty in walking, not elsewhere classified (R26.2);Pain Pain - Right/Left: Right Pain - part of body: Knee     Time: 0930-1000 PT Time Calculation (min) (ACUTE ONLY): 30 min  Charges:  $Gait Training: 23-37 mins                     Earney Navy, PTA Acute Rehabilitation Services Pager: [phone] Office: [phone]     Darliss Cheney 06/11/2018, 11:08 AM

## 2018-06-12 DIAGNOSIS — Z96651 Presence of right artificial knee joint: Secondary | ICD-10-CM | POA: Diagnosis not present

## 2018-06-12 DIAGNOSIS — M2141 Flat foot [pes planus] (acquired), right foot: Secondary | ICD-10-CM | POA: Diagnosis not present

## 2018-06-12 DIAGNOSIS — Z9181 History of falling: Secondary | ICD-10-CM | POA: Diagnosis not present

## 2018-06-12 DIAGNOSIS — M2142 Flat foot [pes planus] (acquired), left foot: Secondary | ICD-10-CM | POA: Diagnosis not present

## 2018-06-12 DIAGNOSIS — Z471 Aftercare following joint replacement surgery: Secondary | ICD-10-CM | POA: Diagnosis not present

## 2018-06-15 DIAGNOSIS — Z96651 Presence of right artificial knee joint: Secondary | ICD-10-CM | POA: Diagnosis not present

## 2018-06-15 DIAGNOSIS — Z471 Aftercare following joint replacement surgery: Secondary | ICD-10-CM | POA: Diagnosis not present

## 2018-06-15 DIAGNOSIS — M2142 Flat foot [pes planus] (acquired), left foot: Secondary | ICD-10-CM | POA: Diagnosis not present

## 2018-06-15 DIAGNOSIS — Z9181 History of falling: Secondary | ICD-10-CM | POA: Diagnosis not present

## 2018-06-15 DIAGNOSIS — M2141 Flat foot [pes planus] (acquired), right foot: Secondary | ICD-10-CM | POA: Diagnosis not present

## 2018-06-17 DIAGNOSIS — Z471 Aftercare following joint replacement surgery: Secondary | ICD-10-CM | POA: Diagnosis not present

## 2018-06-17 DIAGNOSIS — Z9181 History of falling: Secondary | ICD-10-CM | POA: Diagnosis not present

## 2018-06-17 DIAGNOSIS — Z96651 Presence of right artificial knee joint: Secondary | ICD-10-CM | POA: Diagnosis not present

## 2018-06-17 DIAGNOSIS — M2141 Flat foot [pes planus] (acquired), right foot: Secondary | ICD-10-CM | POA: Diagnosis not present

## 2018-06-17 DIAGNOSIS — M2142 Flat foot [pes planus] (acquired), left foot: Secondary | ICD-10-CM | POA: Diagnosis not present

## 2018-06-18 DIAGNOSIS — Z471 Aftercare following joint replacement surgery: Secondary | ICD-10-CM | POA: Diagnosis not present

## 2018-06-18 DIAGNOSIS — Z96651 Presence of right artificial knee joint: Secondary | ICD-10-CM | POA: Diagnosis not present

## 2018-06-18 DIAGNOSIS — M2141 Flat foot [pes planus] (acquired), right foot: Secondary | ICD-10-CM | POA: Diagnosis not present

## 2018-06-18 DIAGNOSIS — Z9181 History of falling: Secondary | ICD-10-CM | POA: Diagnosis not present

## 2018-06-18 DIAGNOSIS — M2142 Flat foot [pes planus] (acquired), left foot: Secondary | ICD-10-CM | POA: Diagnosis not present

## 2018-06-20 DIAGNOSIS — Z96651 Presence of right artificial knee joint: Secondary | ICD-10-CM | POA: Diagnosis not present

## 2018-06-20 DIAGNOSIS — M2141 Flat foot [pes planus] (acquired), right foot: Secondary | ICD-10-CM | POA: Diagnosis not present

## 2018-06-20 DIAGNOSIS — Z471 Aftercare following joint replacement surgery: Secondary | ICD-10-CM | POA: Diagnosis not present

## 2018-06-20 DIAGNOSIS — Z9181 History of falling: Secondary | ICD-10-CM | POA: Diagnosis not present

## 2018-06-20 DIAGNOSIS — M2142 Flat foot [pes planus] (acquired), left foot: Secondary | ICD-10-CM | POA: Diagnosis not present

## 2018-06-24 DIAGNOSIS — R269 Unspecified abnormalities of gait and mobility: Secondary | ICD-10-CM | POA: Diagnosis not present

## 2018-06-24 DIAGNOSIS — M25561 Pain in right knee: Secondary | ICD-10-CM | POA: Diagnosis not present

## 2018-06-24 DIAGNOSIS — M6281 Muscle weakness (generalized): Secondary | ICD-10-CM | POA: Diagnosis not present

## 2018-06-24 DIAGNOSIS — Z96651 Presence of right artificial knee joint: Secondary | ICD-10-CM | POA: Diagnosis not present

## 2018-06-24 DIAGNOSIS — Z471 Aftercare following joint replacement surgery: Secondary | ICD-10-CM | POA: Diagnosis not present

## 2018-06-24 DIAGNOSIS — M25661 Stiffness of right knee, not elsewhere classified: Secondary | ICD-10-CM | POA: Diagnosis not present

## 2018-06-26 ENCOUNTER — Ambulatory Visit: Payer: Medicare Other

## 2018-06-26 ENCOUNTER — Other Ambulatory Visit: Payer: Medicare Other

## 2018-06-27 DIAGNOSIS — M6281 Muscle weakness (generalized): Secondary | ICD-10-CM | POA: Diagnosis not present

## 2018-06-27 DIAGNOSIS — Z96651 Presence of right artificial knee joint: Secondary | ICD-10-CM | POA: Diagnosis not present

## 2018-06-27 DIAGNOSIS — Z471 Aftercare following joint replacement surgery: Secondary | ICD-10-CM | POA: Diagnosis not present

## 2018-06-27 DIAGNOSIS — M25561 Pain in right knee: Secondary | ICD-10-CM | POA: Diagnosis not present

## 2018-06-27 DIAGNOSIS — M25661 Stiffness of right knee, not elsewhere classified: Secondary | ICD-10-CM | POA: Diagnosis not present

## 2018-06-27 DIAGNOSIS — R269 Unspecified abnormalities of gait and mobility: Secondary | ICD-10-CM | POA: Diagnosis not present

## 2018-07-01 DIAGNOSIS — R269 Unspecified abnormalities of gait and mobility: Secondary | ICD-10-CM | POA: Diagnosis not present

## 2018-07-01 DIAGNOSIS — Z471 Aftercare following joint replacement surgery: Secondary | ICD-10-CM | POA: Diagnosis not present

## 2018-07-01 DIAGNOSIS — M25561 Pain in right knee: Secondary | ICD-10-CM | POA: Diagnosis not present

## 2018-07-01 DIAGNOSIS — M6281 Muscle weakness (generalized): Secondary | ICD-10-CM | POA: Diagnosis not present

## 2018-07-01 DIAGNOSIS — M25661 Stiffness of right knee, not elsewhere classified: Secondary | ICD-10-CM | POA: Diagnosis not present

## 2018-07-01 DIAGNOSIS — Z96651 Presence of right artificial knee joint: Secondary | ICD-10-CM | POA: Diagnosis not present

## 2018-07-04 DIAGNOSIS — Z96651 Presence of right artificial knee joint: Secondary | ICD-10-CM | POA: Diagnosis not present

## 2018-07-04 DIAGNOSIS — R269 Unspecified abnormalities of gait and mobility: Secondary | ICD-10-CM | POA: Diagnosis not present

## 2018-07-04 DIAGNOSIS — M25661 Stiffness of right knee, not elsewhere classified: Secondary | ICD-10-CM | POA: Diagnosis not present

## 2018-07-04 DIAGNOSIS — M6281 Muscle weakness (generalized): Secondary | ICD-10-CM | POA: Diagnosis not present

## 2018-07-04 DIAGNOSIS — Z471 Aftercare following joint replacement surgery: Secondary | ICD-10-CM | POA: Diagnosis not present

## 2018-07-04 DIAGNOSIS — M25561 Pain in right knee: Secondary | ICD-10-CM | POA: Diagnosis not present

## 2018-07-08 DIAGNOSIS — M25561 Pain in right knee: Secondary | ICD-10-CM | POA: Diagnosis not present

## 2018-07-08 DIAGNOSIS — M6281 Muscle weakness (generalized): Secondary | ICD-10-CM | POA: Diagnosis not present

## 2018-07-08 DIAGNOSIS — Z96651 Presence of right artificial knee joint: Secondary | ICD-10-CM | POA: Diagnosis not present

## 2018-07-08 DIAGNOSIS — M25661 Stiffness of right knee, not elsewhere classified: Secondary | ICD-10-CM | POA: Diagnosis not present

## 2018-07-08 DIAGNOSIS — R269 Unspecified abnormalities of gait and mobility: Secondary | ICD-10-CM | POA: Diagnosis not present

## 2018-07-08 DIAGNOSIS — Z471 Aftercare following joint replacement surgery: Secondary | ICD-10-CM | POA: Diagnosis not present

## 2018-07-11 DIAGNOSIS — M25561 Pain in right knee: Secondary | ICD-10-CM | POA: Diagnosis not present

## 2018-07-11 DIAGNOSIS — M25661 Stiffness of right knee, not elsewhere classified: Secondary | ICD-10-CM | POA: Diagnosis not present

## 2018-07-11 DIAGNOSIS — Z96651 Presence of right artificial knee joint: Secondary | ICD-10-CM | POA: Diagnosis not present

## 2018-07-11 DIAGNOSIS — R269 Unspecified abnormalities of gait and mobility: Secondary | ICD-10-CM | POA: Diagnosis not present

## 2018-07-11 DIAGNOSIS — M6281 Muscle weakness (generalized): Secondary | ICD-10-CM | POA: Diagnosis not present

## 2018-07-11 DIAGNOSIS — Z471 Aftercare following joint replacement surgery: Secondary | ICD-10-CM | POA: Diagnosis not present

## 2018-07-16 DIAGNOSIS — M6281 Muscle weakness (generalized): Secondary | ICD-10-CM | POA: Diagnosis not present

## 2018-07-16 DIAGNOSIS — R269 Unspecified abnormalities of gait and mobility: Secondary | ICD-10-CM | POA: Diagnosis not present

## 2018-07-16 DIAGNOSIS — M25561 Pain in right knee: Secondary | ICD-10-CM | POA: Diagnosis not present

## 2018-07-16 DIAGNOSIS — Z96651 Presence of right artificial knee joint: Secondary | ICD-10-CM | POA: Diagnosis not present

## 2018-07-16 DIAGNOSIS — Z471 Aftercare following joint replacement surgery: Secondary | ICD-10-CM | POA: Diagnosis not present

## 2018-07-16 DIAGNOSIS — M25661 Stiffness of right knee, not elsewhere classified: Secondary | ICD-10-CM | POA: Diagnosis not present

## 2018-07-18 DIAGNOSIS — M6281 Muscle weakness (generalized): Secondary | ICD-10-CM | POA: Diagnosis not present

## 2018-07-18 DIAGNOSIS — M25661 Stiffness of right knee, not elsewhere classified: Secondary | ICD-10-CM | POA: Diagnosis not present

## 2018-07-18 DIAGNOSIS — Z96651 Presence of right artificial knee joint: Secondary | ICD-10-CM | POA: Diagnosis not present

## 2018-07-18 DIAGNOSIS — R269 Unspecified abnormalities of gait and mobility: Secondary | ICD-10-CM | POA: Diagnosis not present

## 2018-07-18 DIAGNOSIS — M25561 Pain in right knee: Secondary | ICD-10-CM | POA: Diagnosis not present

## 2018-07-18 DIAGNOSIS — Z471 Aftercare following joint replacement surgery: Secondary | ICD-10-CM | POA: Diagnosis not present

## 2018-07-22 DIAGNOSIS — M6281 Muscle weakness (generalized): Secondary | ICD-10-CM | POA: Diagnosis not present

## 2018-07-22 DIAGNOSIS — M25561 Pain in right knee: Secondary | ICD-10-CM | POA: Diagnosis not present

## 2018-07-22 DIAGNOSIS — R269 Unspecified abnormalities of gait and mobility: Secondary | ICD-10-CM | POA: Diagnosis not present

## 2018-07-22 DIAGNOSIS — M25661 Stiffness of right knee, not elsewhere classified: Secondary | ICD-10-CM | POA: Diagnosis not present

## 2018-07-22 DIAGNOSIS — Z471 Aftercare following joint replacement surgery: Secondary | ICD-10-CM | POA: Diagnosis not present

## 2018-07-22 DIAGNOSIS — Z96651 Presence of right artificial knee joint: Secondary | ICD-10-CM | POA: Diagnosis not present

## 2018-07-25 DIAGNOSIS — Z471 Aftercare following joint replacement surgery: Secondary | ICD-10-CM | POA: Diagnosis not present

## 2018-07-25 DIAGNOSIS — Z96651 Presence of right artificial knee joint: Secondary | ICD-10-CM | POA: Diagnosis not present

## 2018-07-25 DIAGNOSIS — M6281 Muscle weakness (generalized): Secondary | ICD-10-CM | POA: Diagnosis not present

## 2018-07-25 DIAGNOSIS — M25561 Pain in right knee: Secondary | ICD-10-CM | POA: Diagnosis not present

## 2018-07-25 DIAGNOSIS — R269 Unspecified abnormalities of gait and mobility: Secondary | ICD-10-CM | POA: Diagnosis not present

## 2018-07-25 DIAGNOSIS — M25661 Stiffness of right knee, not elsewhere classified: Secondary | ICD-10-CM | POA: Diagnosis not present

## 2018-07-29 DIAGNOSIS — R269 Unspecified abnormalities of gait and mobility: Secondary | ICD-10-CM | POA: Diagnosis not present

## 2018-07-29 DIAGNOSIS — Z96651 Presence of right artificial knee joint: Secondary | ICD-10-CM | POA: Diagnosis not present

## 2018-07-29 DIAGNOSIS — Z471 Aftercare following joint replacement surgery: Secondary | ICD-10-CM | POA: Diagnosis not present

## 2018-07-29 DIAGNOSIS — M25561 Pain in right knee: Secondary | ICD-10-CM | POA: Diagnosis not present

## 2018-07-29 DIAGNOSIS — M25661 Stiffness of right knee, not elsewhere classified: Secondary | ICD-10-CM | POA: Diagnosis not present

## 2018-07-29 DIAGNOSIS — M6281 Muscle weakness (generalized): Secondary | ICD-10-CM | POA: Diagnosis not present

## 2018-08-02 DIAGNOSIS — M25661 Stiffness of right knee, not elsewhere classified: Secondary | ICD-10-CM | POA: Diagnosis not present

## 2018-08-02 DIAGNOSIS — Z471 Aftercare following joint replacement surgery: Secondary | ICD-10-CM | POA: Diagnosis not present

## 2018-08-02 DIAGNOSIS — Z96651 Presence of right artificial knee joint: Secondary | ICD-10-CM | POA: Diagnosis not present

## 2018-08-02 DIAGNOSIS — M6281 Muscle weakness (generalized): Secondary | ICD-10-CM | POA: Diagnosis not present

## 2018-08-02 DIAGNOSIS — R269 Unspecified abnormalities of gait and mobility: Secondary | ICD-10-CM | POA: Diagnosis not present

## 2018-08-02 DIAGNOSIS — M25561 Pain in right knee: Secondary | ICD-10-CM | POA: Diagnosis not present

## 2018-08-05 ENCOUNTER — Other Ambulatory Visit: Payer: Medicare Other

## 2018-08-05 DIAGNOSIS — M25561 Pain in right knee: Secondary | ICD-10-CM | POA: Diagnosis not present

## 2018-08-05 DIAGNOSIS — R269 Unspecified abnormalities of gait and mobility: Secondary | ICD-10-CM | POA: Diagnosis not present

## 2018-08-05 DIAGNOSIS — M25661 Stiffness of right knee, not elsewhere classified: Secondary | ICD-10-CM | POA: Diagnosis not present

## 2018-08-05 DIAGNOSIS — Z96651 Presence of right artificial knee joint: Secondary | ICD-10-CM | POA: Diagnosis not present

## 2018-08-05 DIAGNOSIS — M6281 Muscle weakness (generalized): Secondary | ICD-10-CM | POA: Diagnosis not present

## 2018-08-05 DIAGNOSIS — Z471 Aftercare following joint replacement surgery: Secondary | ICD-10-CM | POA: Diagnosis not present

## 2018-08-08 DIAGNOSIS — Z471 Aftercare following joint replacement surgery: Secondary | ICD-10-CM | POA: Diagnosis not present

## 2018-08-08 DIAGNOSIS — M6281 Muscle weakness (generalized): Secondary | ICD-10-CM | POA: Diagnosis not present

## 2018-08-08 DIAGNOSIS — R269 Unspecified abnormalities of gait and mobility: Secondary | ICD-10-CM | POA: Diagnosis not present

## 2018-08-08 DIAGNOSIS — Z96651 Presence of right artificial knee joint: Secondary | ICD-10-CM | POA: Diagnosis not present

## 2018-08-08 DIAGNOSIS — M25561 Pain in right knee: Secondary | ICD-10-CM | POA: Diagnosis not present

## 2018-08-08 DIAGNOSIS — M25661 Stiffness of right knee, not elsewhere classified: Secondary | ICD-10-CM | POA: Diagnosis not present

## 2018-08-12 DIAGNOSIS — Z471 Aftercare following joint replacement surgery: Secondary | ICD-10-CM | POA: Diagnosis not present

## 2018-08-12 DIAGNOSIS — Z96651 Presence of right artificial knee joint: Secondary | ICD-10-CM | POA: Diagnosis not present

## 2018-08-12 DIAGNOSIS — R269 Unspecified abnormalities of gait and mobility: Secondary | ICD-10-CM | POA: Diagnosis not present

## 2018-08-12 DIAGNOSIS — M25661 Stiffness of right knee, not elsewhere classified: Secondary | ICD-10-CM | POA: Diagnosis not present

## 2018-08-12 DIAGNOSIS — M25561 Pain in right knee: Secondary | ICD-10-CM | POA: Diagnosis not present

## 2018-08-12 DIAGNOSIS — M6281 Muscle weakness (generalized): Secondary | ICD-10-CM | POA: Diagnosis not present

## 2018-09-20 DIAGNOSIS — R269 Unspecified abnormalities of gait and mobility: Secondary | ICD-10-CM | POA: Diagnosis not present

## 2018-09-20 DIAGNOSIS — M25561 Pain in right knee: Secondary | ICD-10-CM | POA: Diagnosis not present

## 2018-09-20 DIAGNOSIS — M6281 Muscle weakness (generalized): Secondary | ICD-10-CM | POA: Diagnosis not present

## 2018-09-20 DIAGNOSIS — Z471 Aftercare following joint replacement surgery: Secondary | ICD-10-CM | POA: Diagnosis not present

## 2018-09-20 DIAGNOSIS — Z96651 Presence of right artificial knee joint: Secondary | ICD-10-CM | POA: Diagnosis not present

## 2018-09-20 DIAGNOSIS — M25661 Stiffness of right knee, not elsewhere classified: Secondary | ICD-10-CM | POA: Diagnosis not present

## 2018-11-15 ENCOUNTER — Ambulatory Visit
Admission: RE | Admit: 2018-11-15 | Discharge: 2018-11-15 | Disposition: A | Discharge status: Home / Self Care | Payer: Medicare Other | Source: Ambulatory Visit | Attending: Internal Medicine | Admitting: Internal Medicine

## 2018-11-15 ENCOUNTER — Other Ambulatory Visit: Payer: Self-pay

## 2018-11-15 DIAGNOSIS — R921 Mammographic calcification found on diagnostic imaging of breast: Secondary | ICD-10-CM

## 2018-12-08 DIAGNOSIS — Z20828 Contact with and (suspected) exposure to other viral communicable diseases: Secondary | ICD-10-CM | POA: Diagnosis not present

## 2019-01-23 DIAGNOSIS — Z96651 Presence of right artificial knee joint: Secondary | ICD-10-CM | POA: Diagnosis not present

## 2019-01-23 DIAGNOSIS — M1712 Unilateral primary osteoarthritis, left knee: Secondary | ICD-10-CM | POA: Diagnosis not present

## 2019-02-06 DIAGNOSIS — M25562 Pain in left knee: Secondary | ICD-10-CM | POA: Diagnosis not present

## 2019-02-06 DIAGNOSIS — M1712 Unilateral primary osteoarthritis, left knee: Secondary | ICD-10-CM | POA: Diagnosis not present

## 2019-03-03 DIAGNOSIS — L821 Other seborrheic keratosis: Secondary | ICD-10-CM | POA: Diagnosis not present

## 2019-03-03 DIAGNOSIS — L570 Actinic keratosis: Secondary | ICD-10-CM | POA: Diagnosis not present

## 2019-03-03 DIAGNOSIS — Z85828 Personal history of other malignant neoplasm of skin: Secondary | ICD-10-CM | POA: Diagnosis not present

## 2019-03-03 DIAGNOSIS — C44712 Basal cell carcinoma of skin of right lower limb, including hip: Secondary | ICD-10-CM | POA: Diagnosis not present

## 2019-03-12 ENCOUNTER — Encounter: Payer: Self-pay | Admitting: Gynecology

## 2019-03-25 DIAGNOSIS — Z85828 Personal history of other malignant neoplasm of skin: Secondary | ICD-10-CM | POA: Diagnosis not present

## 2019-03-25 DIAGNOSIS — C44712 Basal cell carcinoma of skin of right lower limb, including hip: Secondary | ICD-10-CM | POA: Diagnosis not present

## 2019-03-31 ENCOUNTER — Other Ambulatory Visit: Payer: Self-pay | Admitting: Internal Medicine

## 2019-03-31 DIAGNOSIS — R921 Mammographic calcification found on diagnostic imaging of breast: Secondary | ICD-10-CM

## 2019-04-19 DIAGNOSIS — Z23 Encounter for immunization: Secondary | ICD-10-CM | POA: Diagnosis not present

## 2019-05-09 ENCOUNTER — Other Ambulatory Visit: Payer: Self-pay

## 2019-05-09 ENCOUNTER — Ambulatory Visit
Admission: RE | Admit: 2019-05-09 | Discharge: 2019-05-09 | Disposition: A | Discharge status: Home / Self Care | Payer: Medicare Other | Source: Ambulatory Visit | Attending: Internal Medicine | Admitting: Internal Medicine

## 2019-05-09 DIAGNOSIS — R921 Mammographic calcification found on diagnostic imaging of breast: Secondary | ICD-10-CM | POA: Diagnosis not present

## 2019-06-05 DIAGNOSIS — Z20828 Contact with and (suspected) exposure to other viral communicable diseases: Secondary | ICD-10-CM | POA: Diagnosis not present

## 2019-06-20 ENCOUNTER — Ambulatory Visit: Payer: Medicare Other | Attending: Internal Medicine

## 2019-06-20 DIAGNOSIS — Z23 Encounter for immunization: Secondary | ICD-10-CM

## 2019-06-20 NOTE — Progress Notes (Signed)
   Covid-19 Vaccination Clinic  Name:  Mary Daniel    MRN: [id number] DOB: 01/05/1951  06/20/2019  [name] was observed post Covid-19 immunization for 15 minutes without incidence. She was provided with Vaccine Information Sheet and instruction to access the V-Safe system.   [name] was instructed to call 911 with any severe reactions post vaccine: Marland Kitchen Difficulty breathing  . Swelling of your face and throat  . A fast heartbeat  . A bad rash all over your body  . Dizziness and weakness    Immunizations Administered    Name Date Dose VIS Date Route   Pfizer COVID-19 Vaccine 06/20/2019 11:09 AM 0.3 mL 05/16/2019 Intramuscular   Manufacturer: [place]   Lot: [id number]   [place]: [id number]

## 2019-07-10 ENCOUNTER — Ambulatory Visit: Payer: Medicare Other | Attending: Internal Medicine

## 2019-07-10 DIAGNOSIS — Z23 Encounter for immunization: Secondary | ICD-10-CM | POA: Insufficient documentation

## 2019-07-10 NOTE — Progress Notes (Signed)
   Covid-19 Vaccination Clinic  Name:  EMMETT GOLDWASSER    MRN: [id number] DOB: September 04, 1950  07/10/2019  Ms. Baroni was observed post Covid-19 immunization for 15 minutes without incidence. She was provided with Vaccine Information Sheet and instruction to access the V-Safe system.   Ms. Burda was instructed to call 911 with any severe reactions post vaccine: Marland Kitchen Difficulty breathing  . Swelling of your face and throat  . A fast heartbeat  . A bad rash all over your body  . Dizziness and weakness    Immunizations Administered    Name Date Dose VIS Date Route   Pfizer COVID-19 Vaccine 07/10/2019 12:32 PM 0.3 mL 05/16/2019 Intramuscular   Manufacturer: [place]   Lot: [id number]   [place]: [id number]

## 2019-07-25 DIAGNOSIS — Z20822 Contact with and (suspected) exposure to covid-19: Secondary | ICD-10-CM | POA: Diagnosis not present

## 2019-07-25 DIAGNOSIS — Z03818 Encounter for observation for suspected exposure to other biological agents ruled out: Secondary | ICD-10-CM | POA: Diagnosis not present

## 2019-08-06 DIAGNOSIS — H4323 Crystalline deposits in vitreous body, bilateral: Secondary | ICD-10-CM | POA: Diagnosis not present

## 2019-08-06 DIAGNOSIS — H5203 Hypermetropia, bilateral: Secondary | ICD-10-CM | POA: Diagnosis not present

## 2019-08-25 ENCOUNTER — Ambulatory Visit (INDEPENDENT_AMBULATORY_CARE_PROVIDER_SITE_OTHER): Payer: Medicare Other | Admitting: Women's Health

## 2019-08-25 ENCOUNTER — Encounter: Payer: Self-pay | Admitting: Women's Health

## 2019-08-25 ENCOUNTER — Other Ambulatory Visit: Payer: Self-pay

## 2019-08-25 VITALS — BP 126/80

## 2019-08-25 DIAGNOSIS — R350 Frequency of micturition: Secondary | ICD-10-CM

## 2019-08-25 DIAGNOSIS — N952 Postmenopausal atrophic vaginitis: Secondary | ICD-10-CM | POA: Diagnosis not present

## 2019-08-25 DIAGNOSIS — N3000 Acute cystitis without hematuria: Secondary | ICD-10-CM | POA: Diagnosis not present

## 2019-08-25 MED ORDER — ESTRADIOL 10 MCG VA TABS: ORAL_TABLET | VAGINAL | 4 refills | Status: DC

## 2019-08-25 MED ORDER — NITROFURANTOIN MONOHYD MACRO 100 MG PO CAPS: 100.0000 mg | ORAL_CAPSULE | Freq: Two times a day (BID) | ORAL | 0 refills | Status: DC
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
## 2019-08-25 NOTE — Patient Instructions (Signed)
Urinary Tract Infection, Adult A urinary tract infection (UTI) is an infection of any part of the urinary tract. The urinary tract includes:  The kidneys.  The ureters.  The bladder.  The urethra. These organs make, store, and get rid of pee (urine) in the body. What are the causes? This is caused by germs (bacteria) in your genital area. These germs grow and cause swelling (inflammation) of your urinary tract. What increases the risk? You are more likely to develop this condition if:  You have a small, thin tube (catheter) to drain pee.  You cannot control when you pee or poop (incontinence).  You are female, and: ? You use these methods to prevent pregnancy:  A medicine that kills sperm (spermicide).  A device that blocks sperm (diaphragm). ? You have low levels of a female hormone (estrogen). ? You are pregnant.  You have genes that add to your risk.  You are sexually active.  You take antibiotic medicines.  You have trouble peeing because of: ? A prostate that is bigger than normal, if you are female. ? A blockage in the part of your body that drains pee from the bladder (urethra). ? A kidney stone. ? A nerve condition that affects your bladder (neurogenic bladder). ? Not getting enough to drink. ? Not peeing often enough.  You have other conditions, such as: ? Diabetes. ? A weak disease-fighting system (immune system). ? Sickle cell disease. ? Gout. ? Injury of the spine. What are the signs or symptoms? Symptoms of this condition include:  Needing to pee right away (urgently).  Peeing often.  Peeing small amounts often.  Pain or burning when peeing.  Blood in the pee.  Pee that smells bad or not like normal.  Trouble peeing.  Pee that is cloudy.  Fluid coming from the vagina, if you are female.  Pain in the belly or lower back. Other symptoms include:  Throwing up (vomiting).  No urge to eat.  Feeling mixed up (confused).  Being tired  and grouchy (irritable).  A fever.  Watery poop (diarrhea). How is this treated? This condition may be treated with:  Antibiotic medicine.  Other medicines.  Drinking enough water. Follow these instructions at home:  Medicines  Take over-the-counter and prescription medicines only as told by your doctor.  If you were prescribed an antibiotic medicine, take it as told by your doctor. Do not stop taking it even if you start to feel better. General instructions  Make sure you: ? Pee until your bladder is empty. ? Do not hold pee for a long time. ? Empty your bladder after sex. ? Wipe from front to back after pooping if you are a female. Use each tissue one time when you wipe.  Drink enough fluid to keep your pee pale yellow.  Keep all follow-up visits as told by your doctor. This is important. Contact a doctor if:  You do not get better after 1-2 days.  Your symptoms go away and then come back. Get help right away if:  You have very bad back pain.  You have very bad pain in your lower belly.  You have a fever.  You are sick to your stomach (nauseous).  You are throwing up. Summary  A urinary tract infection (UTI) is an infection of any part of the urinary tract.  This condition is caused by germs in your genital area.  There are many risk factors for a UTI. These include having a small, thin   tube to drain pee and not being able to control when you pee or poop.  Treatment includes antibiotic medicines for germs.  Drink enough fluid to keep your pee pale yellow. This information is not intended to replace advice given to you by your health care provider. Make sure you discuss any questions you have with your health care provider. Document Revised: 05/09/2018 Document Reviewed: 11/29/2017 Elsevier Patient Education  2020 [employer]  

## 2019-08-26 ENCOUNTER — Telehealth: Payer: Self-pay

## 2019-08-26 DIAGNOSIS — M25552 Pain in left hip: Secondary | ICD-10-CM | POA: Diagnosis not present

## 2019-08-26 DIAGNOSIS — M545 Low back pain: Secondary | ICD-10-CM | POA: Diagnosis not present

## 2019-08-26 NOTE — Telephone Encounter (Signed)
Yes that sounds perfect, please call that in -  your the best.!!!

## 2019-08-26 NOTE — Telephone Encounter (Signed)
Estradiol vaginal tabs not covered by insurance.

## 2019-08-26 NOTE — Telephone Encounter (Signed)
The compound estradiol cream at [facility] is Estradiol 0.02% and one gram is a dose.  Is that what you are referring to?

## 2019-08-26 NOTE — Telephone Encounter (Signed)
Please call in estradiol 0.2 vaginal cream 1 applicator [id number] mg twice weekly vaginally #1 tube with 12 refills.  Call patient and tell her that this will be a compounded prescription that is much less expensive and may help with the vaginal dryness.  Thank you

## 2019-08-27 MED ORDER — NONFORMULARY OR COMPOUNDED ITEM: 4 refills | Status: DC

## 2019-08-27 NOTE — Telephone Encounter (Signed)
Spoke with patient and let her know that Estradiol Vag Tabs not covered by ins. I called her pharmacy and cancelled that Rx. I explained about the compound estradiol cream and she is fine with that. Reviewed directions.  Called in to [facility] at her request.

## 2019-08-28 LAB — URINE CULTURE
MICRO NUMBER:: 10276715
SPECIMEN QUALITY:: ADEQUATE

## 2019-08-28 LAB — URINALYSIS, COMPLETE W/RFL CULTURE
Bilirubin Urine: NEGATIVE
Glucose, UA: NEGATIVE
Hyaline Cast: NONE SEEN /LPF
Ketones, ur: NEGATIVE
Nitrites, Initial: NEGATIVE
Specific Gravity, Urine: 1.025 (ref 1.001–1.03)
WBC, UA: >=60 /HPF — AB (ref 0–5)
pH: 5.5 (ref 5.0–8.0)

## 2019-08-28 LAB — CULTURE INDICATED

## 2019-09-10 DIAGNOSIS — M25552 Pain in left hip: Secondary | ICD-10-CM | POA: Diagnosis not present

## 2019-09-14 DIAGNOSIS — Z20828 Contact with and (suspected) exposure to other viral communicable diseases: Secondary | ICD-10-CM | POA: Diagnosis not present

## 2019-09-14 DIAGNOSIS — Z03818 Encounter for observation for suspected exposure to other biological agents ruled out: Secondary | ICD-10-CM | POA: Diagnosis not present

## 2019-09-17 ENCOUNTER — Encounter: Payer: Medicare Other | Admitting: Women's Health

## 2019-11-07 DIAGNOSIS — L570 Actinic keratosis: Secondary | ICD-10-CM | POA: Diagnosis not present

## 2019-11-07 DIAGNOSIS — Z85828 Personal history of other malignant neoplasm of skin: Secondary | ICD-10-CM | POA: Diagnosis not present

## 2019-11-07 DIAGNOSIS — A4902 Methicillin resistant Staphylococcus aureus infection, unspecified site: Secondary | ICD-10-CM | POA: Diagnosis not present

## 2020-02-17 DIAGNOSIS — M1712 Unilateral primary osteoarthritis, left knee: Secondary | ICD-10-CM | POA: Diagnosis not present

## 2020-02-17 DIAGNOSIS — M25561 Pain in right knee: Secondary | ICD-10-CM | POA: Diagnosis not present

## 2020-03-02 ENCOUNTER — Other Ambulatory Visit: Payer: Self-pay

## 2020-03-02 ENCOUNTER — Ambulatory Visit (INDEPENDENT_AMBULATORY_CARE_PROVIDER_SITE_OTHER): Payer: Medicare Other | Admitting: Nurse Practitioner

## 2020-03-02 ENCOUNTER — Encounter: Payer: Self-pay | Admitting: Nurse Practitioner

## 2020-03-02 VITALS — BP 124/80

## 2020-03-02 DIAGNOSIS — N3000 Acute cystitis without hematuria: Secondary | ICD-10-CM | POA: Diagnosis not present

## 2020-03-02 DIAGNOSIS — R350 Frequency of micturition: Secondary | ICD-10-CM | POA: Diagnosis not present

## 2020-03-02 MED ORDER — SULFAMETHOXAZOLE-TRIMETHOPRIM 800-160 MG PO TABS: 1.0000 | ORAL_TABLET | Freq: Two times a day (BID) | ORAL | 0 refills | Status: DC

## 2020-03-02 NOTE — Progress Notes (Signed)
   Acute Office Visit  Subjective:    Patient ID: Mary Daniel, female    DOB: 1951/02/14, 69 y.o.   MRN: [id number]   HPI 69 y.o. presents today for urinary frequency and back pain that started a few days ago. Denies dysuria or hematuria, denies any vaginal symptoms. History of UTIs and says these are her classic symptoms.     Review of Systems  Constitutional: Negative.   Genitourinary: Positive for flank pain and frequency. Negative for dysuria, hematuria and vaginal discharge.       Objective:    Physical Exam Constitutional:      Appearance: Normal appearance.  Abdominal:     Tenderness: There is no right CVA tenderness or left CVA tenderness.     BP 124/80  Wt Readings from Last 3 Encounters:  06/11/18 177 lb 14.6 oz (80.7 kg)  05/31/18 177 lb 9 oz (80.5 kg)  05/23/16 194 lb (88 kg)   UA 1+ leukocytes, wbc 20-40, rbc 0-2, few bacteria     Assessment & Plan:   Problem List Items Addressed This Visit    None    Visit Diagnoses    Acute cystitis without hematuria    -  Primary   Relevant Medications   sulfamethoxazole-trimethoprim (BACTRIM DS) 800-160 MG tablet   Frequent urination       Relevant Orders   Urinalysis,Complete w/RFL Culture     Plan: UA and symptoms consistent with UTI. Bactrim 800-160 mg twice a day for 5 days. Urine culture pending. Return to office if symptoms worsen or do not improve. She is agreeable to plan.      Tamela Gammon [facility], 12:56 PM 03/02/2020

## 2020-03-02 NOTE — Patient Instructions (Addendum)
[place]   Urinary Tract Infection, Adult A urinary tract infection (UTI) is an infection of any part of the urinary tract. The urinary tract includes:  The kidneys.  The ureters.  The bladder.  The urethra. These organs make, store, and get rid of pee (urine) in the body. What are the causes? This is caused by germs (bacteria) in your genital area. These germs grow and cause swelling (inflammation) of your urinary tract. What increases the risk? You are more likely to develop this condition if:  You have a small, thin tube (catheter) to drain pee.  You cannot control when you pee or poop (incontinence).  You are female, and: ? You use these methods to prevent pregnancy:  A medicine that kills sperm (spermicide).  A device that blocks sperm (diaphragm). ? You have low levels of a female hormone (estrogen). ? You are pregnant.  You have genes that add to your risk.  You are sexually active.  You take antibiotic medicines.  You have trouble peeing because of: ? A prostate that is bigger than normal, if you are female. ? A blockage in the part of your body that drains pee from the bladder (urethra). ? A kidney stone. ? A nerve condition that affects your bladder (neurogenic bladder). ? Not getting enough to drink. ? Not peeing often enough.  You have other conditions, such as: ? Diabetes. ? A weak disease-fighting system (immune system). ? Sickle cell disease. ? Gout. ? Injury of the spine. What are the signs or symptoms? Symptoms of this condition include:  Needing to pee right away (urgently).  Peeing often.  Peeing small amounts often.  Pain or burning when peeing.  Blood in the pee.  Pee that smells bad or not like normal.  Trouble peeing.  Pee that is cloudy.  Fluid coming from the vagina, if you are female.  Pain in the belly or lower back. Other symptoms include:  Throwing up (vomiting).  No urge to eat.  Feeling mixed up  (confused).  Being tired and grouchy (irritable).  A fever.  Watery poop (diarrhea). How is this treated? This condition may be treated with:  Antibiotic medicine.  Other medicines.  Drinking enough water. Follow these instructions at home:  Medicines  Take over-the-counter and prescription medicines only as told by your doctor.  If you were prescribed an antibiotic medicine, take it as told by your doctor. Do not stop taking it even if you start to feel better. General instructions  Make sure you: ? Pee until your bladder is empty. ? Do not hold pee for a long time. ? Empty your bladder after sex. ? Wipe from front to back after pooping if you are a female. Use each tissue one time when you wipe.  Drink enough fluid to keep your pee pale yellow.  Keep all follow-up visits as told by your doctor. This is important. Contact a doctor if:  You do not get better after 1-2 days.  Your symptoms go away and then come back. Get help right away if:  You have very bad back pain.  You have very bad pain in your lower belly.  You have a fever.  You are sick to your stomach (nauseous).  You are throwing up. Summary  A urinary tract infection (UTI) is an infection of any part of the urinary tract.  This condition is caused by germs in your genital area.  There are many risk factors for a UTI. These include having  a small, thin tube to drain pee and not being able to control when you pee or poop.  Treatment includes antibiotic medicines for germs.  Drink enough fluid to keep your pee pale yellow. This information is not intended to replace advice given to you by your health care provider. Make sure you discuss any questions you have with your health care provider. Document Revised: 05/09/2018 Document Reviewed: 11/29/2017 Elsevier Patient Education  2020 [employer].

## 2020-03-03 ENCOUNTER — Telehealth: Payer: Self-pay | Admitting: *Deleted

## 2020-03-03 ENCOUNTER — Other Ambulatory Visit: Payer: Self-pay | Admitting: Nurse Practitioner

## 2020-03-03 ENCOUNTER — Encounter: Payer: Self-pay | Admitting: Nurse Practitioner

## 2020-03-03 DIAGNOSIS — N3000 Acute cystitis without hematuria: Secondary | ICD-10-CM

## 2020-03-03 MED ORDER — NITROFURANTOIN MONOHYD MACRO 100 MG PO CAPS: 100.0000 mg | ORAL_CAPSULE | Freq: Two times a day (BID) | ORAL | 0 refills | Status: DC

## 2020-03-03 NOTE — Telephone Encounter (Signed)
Patient informed. 

## 2020-03-03 NOTE — Telephone Encounter (Signed)
Patient was seen yesterday for UTI, prescribed Bactrim DS 1 po BID x 5 days, reports last night she took the 1st dose of medication at 9pm, at 3am she woke up and  lips appeared to be swollen, left eye stinging now also noted slight headache. She asked if another medication could be prescribed? Please advise

## 2020-03-03 NOTE — Progress Notes (Signed)
She had reaction to Bactrim after 1 dose that involved swollen lips and headache. Switched to Macrodantin and medication added to allergy list.

## 2020-03-03 NOTE — Telephone Encounter (Signed)
Yes I have sent in Macrodantin and added Bactrim to her allergy list. I recommend her taking benadryl for swelling. Thank you.

## 2020-03-04 ENCOUNTER — Other Ambulatory Visit: Payer: Self-pay | Admitting: Nurse Practitioner

## 2020-03-04 DIAGNOSIS — R8271 Bacteriuria: Secondary | ICD-10-CM

## 2020-03-04 LAB — URINALYSIS, COMPLETE W/RFL CULTURE
Bilirubin Urine: NEGATIVE
Glucose, UA: NEGATIVE
Hgb urine dipstick: NEGATIVE
Hyaline Cast: NONE SEEN /LPF
Ketones, ur: NEGATIVE
Nitrites, Initial: NEGATIVE
Protein, ur: NEGATIVE
Specific Gravity, Urine: 1.015 (ref 1.001–1.03)
pH: 7 (ref 5.0–8.0)

## 2020-03-04 LAB — URINE CULTURE
MICRO NUMBER:: 11004449
SPECIMEN QUALITY:: ADEQUATE

## 2020-03-04 LAB — CULTURE INDICATED

## 2020-03-04 MED ORDER — AMOXICILLIN-POT CLAVULANATE 875-125 MG PO TABS: 1.0000 | ORAL_TABLET | Freq: Two times a day (BID) | ORAL | 0 refills | Status: AC

## 2020-03-25 DIAGNOSIS — Z23 Encounter for immunization: Secondary | ICD-10-CM | POA: Diagnosis not present

## 2020-03-29 ENCOUNTER — Other Ambulatory Visit: Payer: Self-pay | Admitting: Internal Medicine

## 2020-03-29 DIAGNOSIS — L565 Disseminated superficial actinic porokeratosis (DSAP): Secondary | ICD-10-CM | POA: Diagnosis not present

## 2020-03-29 DIAGNOSIS — R921 Mammographic calcification found on diagnostic imaging of breast: Secondary | ICD-10-CM

## 2020-03-29 DIAGNOSIS — L304 Erythema intertrigo: Secondary | ICD-10-CM | POA: Diagnosis not present

## 2020-03-29 DIAGNOSIS — Z85828 Personal history of other malignant neoplasm of skin: Secondary | ICD-10-CM | POA: Diagnosis not present

## 2020-03-29 DIAGNOSIS — L570 Actinic keratosis: Secondary | ICD-10-CM | POA: Diagnosis not present

## 2020-03-30 ENCOUNTER — Encounter: Payer: Medicare Other | Admitting: Nurse Practitioner

## 2020-04-26 ENCOUNTER — Ambulatory Visit (INDEPENDENT_AMBULATORY_CARE_PROVIDER_SITE_OTHER): Payer: Medicare Other | Admitting: Nurse Practitioner

## 2020-04-26 ENCOUNTER — Encounter: Payer: Self-pay | Admitting: Nurse Practitioner

## 2020-04-26 ENCOUNTER — Other Ambulatory Visit: Payer: Self-pay

## 2020-04-26 VITALS — BP 124/80

## 2020-04-26 DIAGNOSIS — R350 Frequency of micturition: Secondary | ICD-10-CM

## 2020-04-26 DIAGNOSIS — N3000 Acute cystitis without hematuria: Secondary | ICD-10-CM | POA: Diagnosis not present

## 2020-04-26 LAB — URINALYSIS, COMPLETE W/RFL CULTURE: Glucose, UA: NEGATIVE

## 2020-04-26 MED ORDER — NITROFURANTOIN MONOHYD MACRO 100 MG PO CAPS: 100.0000 mg | ORAL_CAPSULE | Freq: Two times a day (BID) | ORAL | 0 refills | Status: DC

## 2020-04-26 NOTE — Progress Notes (Signed)
   Acute Office Visit  Subjective:    Patient ID: Mary Daniel, female    DOB: 1950-07-19, 69 y.o.   MRN: [id number]   HPI 69 y.o. presents today for urinary frequency and burning with urination that started a few days ago. Denies vaginal symptoms.   Review of Systems  Constitutional: Negative.   Gastrointestinal: Negative.   Genitourinary: Positive for dysuria, frequency and urgency. Negative for hematuria and vaginal discharge.       Objective:    Physical Exam Constitutional:      Appearance: Normal appearance.  Abdominal:     Tenderness: There is no right CVA tenderness or left CVA tenderness.     BP 124/80 (BP Location: Right Arm, Patient Position: Sitting, Cuff Size: Normal)  Wt Readings from Last 3 Encounters:  06/11/18 177 lb 14.6 oz (80.7 kg)  05/31/18 177 lb 9 oz (80.5 kg)  05/23/16 194 lb (88 kg)   UA: 1+ leukocytes, negative nitrites, wbc 20-40, rbc 0-2, moderate bacteria     Assessment & Plan:   Problem List Items Addressed This Visit    None    Visit Diagnoses    Acute cystitis without hematuria    -  Primary   Relevant Medications   nitrofurantoin, macrocrystal-monohydrate, (MACROBID) 100 MG capsule   Urinary frequency       Relevant Orders   Urinalysis,Complete w/RFL Culture     Plan: UA and symptoms consistent with acute cystitis. Macrobid 100 mg twice a day for 7 days. Culture pending. She was supposed to get hip injection this week but will need to reschedule due to current infection. She will return if symptoms worsen or do not improve. She is agreeable to plan.      [place], 3:21 PM 04/26/2020

## 2020-04-27 LAB — URINE CULTURE
MICRO NUMBER:: 11232843
SPECIMEN QUALITY:: ADEQUATE

## 2020-04-27 LAB — URINALYSIS, COMPLETE W/RFL CULTURE
Bilirubin Urine: NEGATIVE
Hyaline Cast: NONE SEEN /LPF
Nitrites, Initial: NEGATIVE
Protein, ur: NEGATIVE
Specific Gravity, Urine: 1.025 (ref 1.001–1.03)
pH: 5.5 (ref 5.0–8.0)

## 2020-04-27 LAB — CULTURE INDICATED

## 2020-05-07 DIAGNOSIS — M25552 Pain in left hip: Secondary | ICD-10-CM | POA: Diagnosis not present

## 2020-05-10 ENCOUNTER — Ambulatory Visit
Admission: RE | Admit: 2020-05-10 | Discharge: 2020-05-10 | Disposition: A | Discharge status: Home / Self Care | Payer: Medicare Other | Source: Ambulatory Visit | Attending: Internal Medicine | Admitting: Internal Medicine

## 2020-05-10 ENCOUNTER — Other Ambulatory Visit: Payer: Self-pay

## 2020-05-10 DIAGNOSIS — R921 Mammographic calcification found on diagnostic imaging of breast: Secondary | ICD-10-CM

## 2020-05-17 DIAGNOSIS — L718 Other rosacea: Secondary | ICD-10-CM | POA: Diagnosis not present

## 2020-05-17 DIAGNOSIS — L570 Actinic keratosis: Secondary | ICD-10-CM | POA: Diagnosis not present

## 2020-05-17 DIAGNOSIS — Z85828 Personal history of other malignant neoplasm of skin: Secondary | ICD-10-CM | POA: Diagnosis not present

## 2020-06-05 IMAGING — MG DIGITAL SCREENING BILATERAL MAMMOGRAM WITH TOMO AND CAD
8 series · 8 of 24 positions shown · non-contrast
Comparison: Previous exam(s).

CLINICAL DATA: Screening.

EXAM:
DIGITAL SCREENING BILATERAL MAMMOGRAM WITH TOMO AND CAD

[R MLO synth-2D]
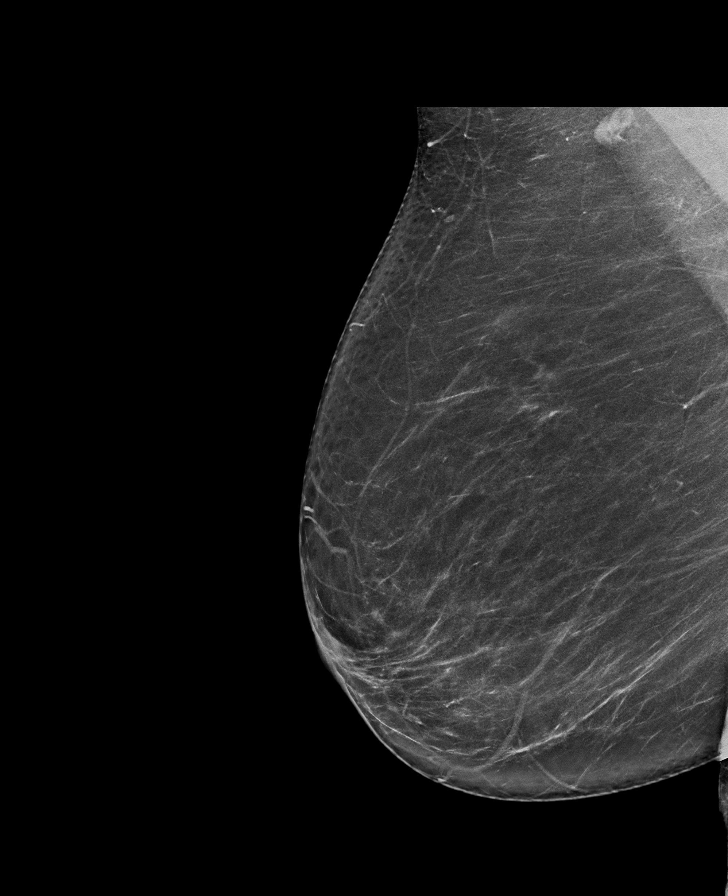

[R CC synth-2D]
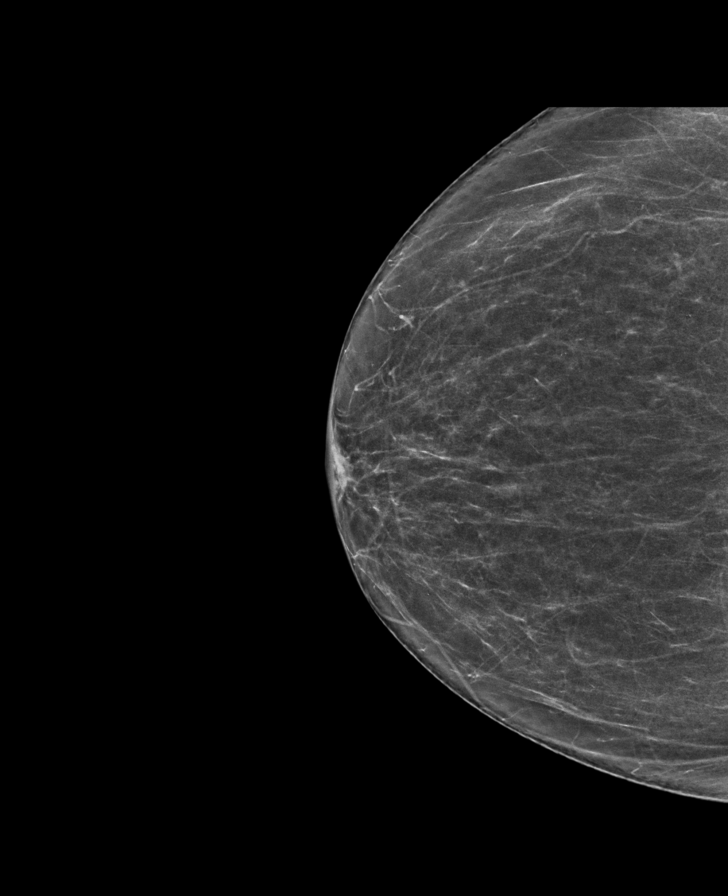

[L MLO synth-2D]
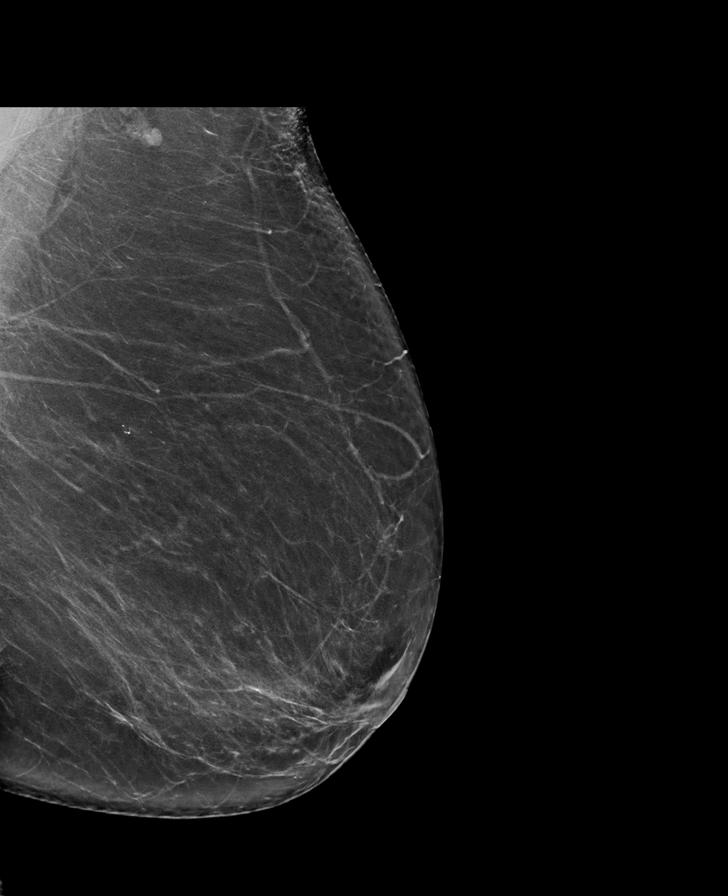

[L CC synth-2D]
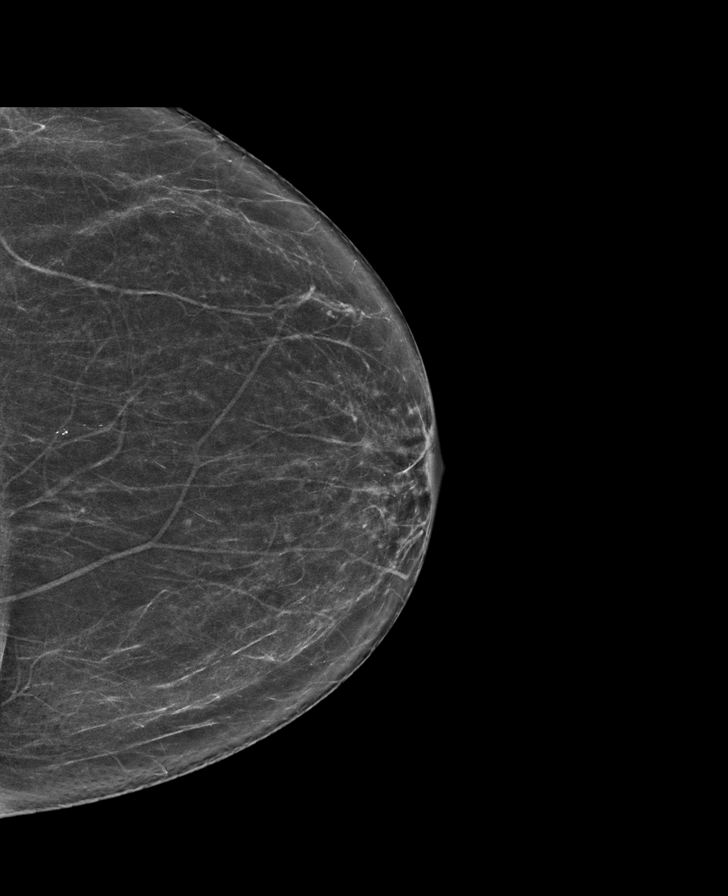

[R MLO tomo · tomo slice 42/83.0]
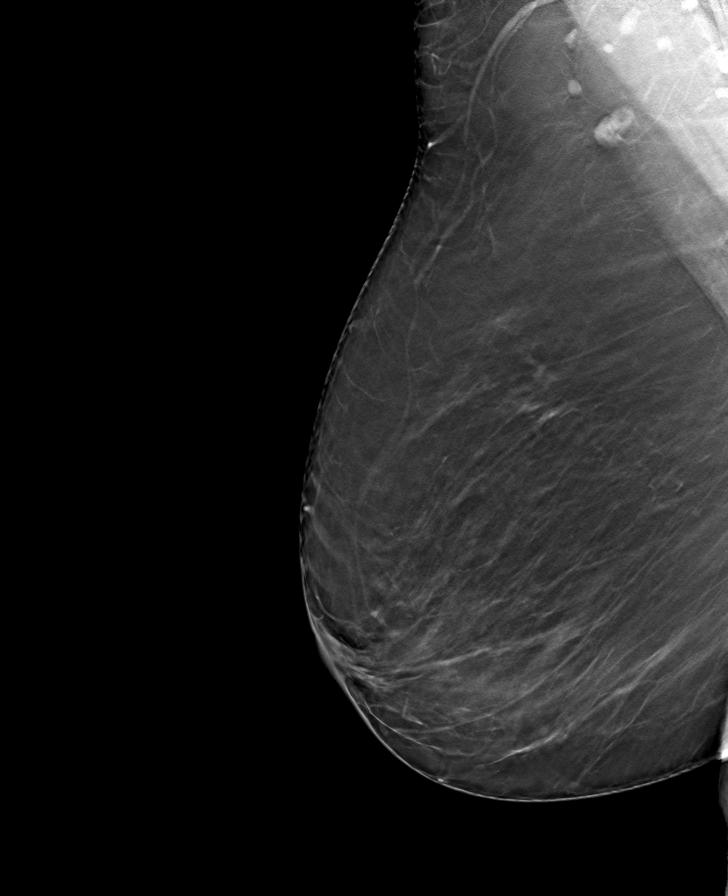

[R CC tomo · tomo slice 35/68.0]
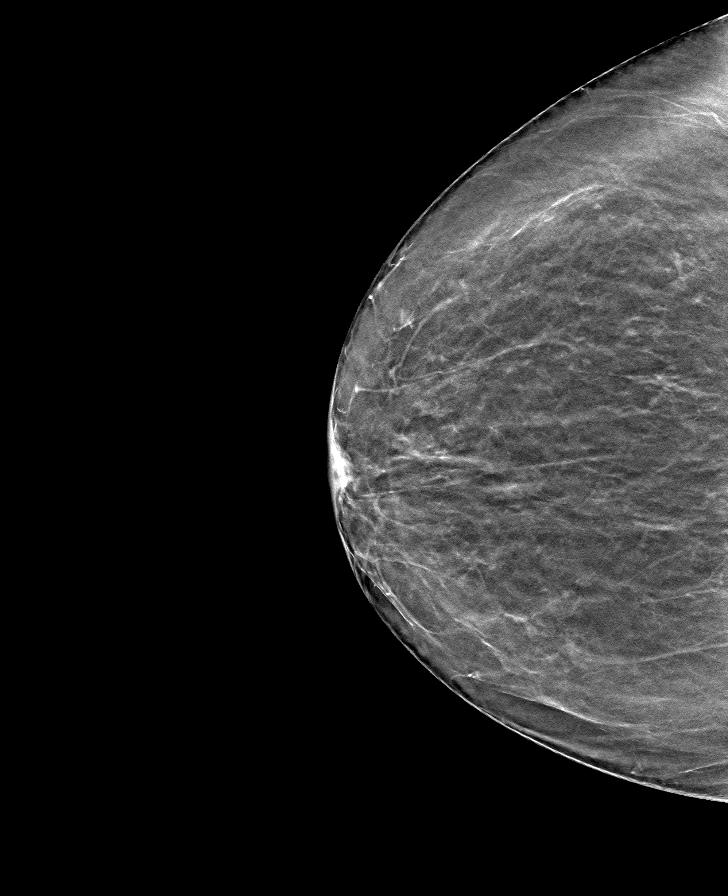

[L MLO tomo · tomo slice 43/85.0]
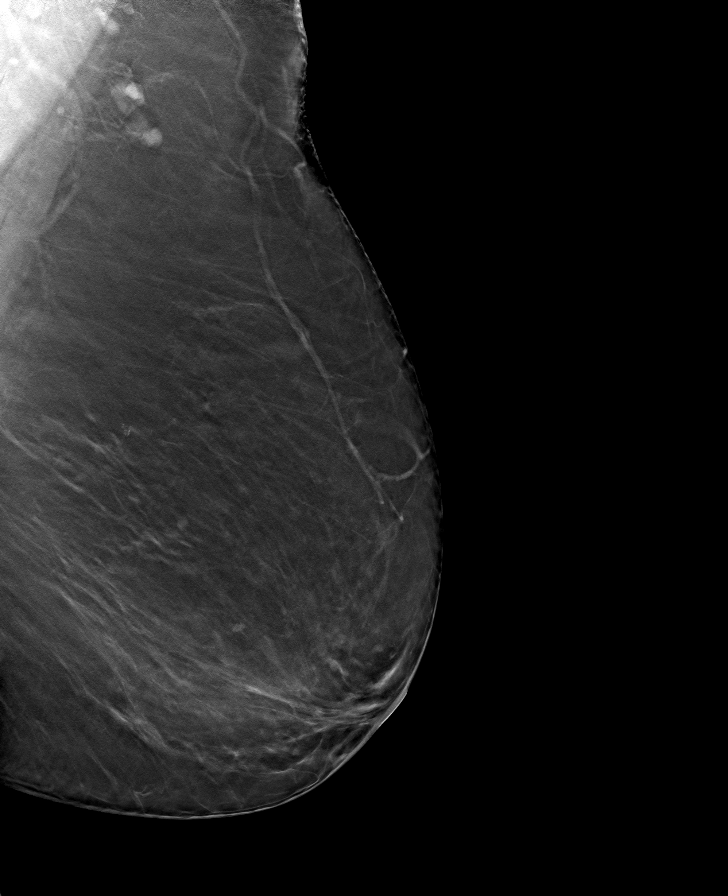

[L CC tomo · tomo slice 35/70.0]
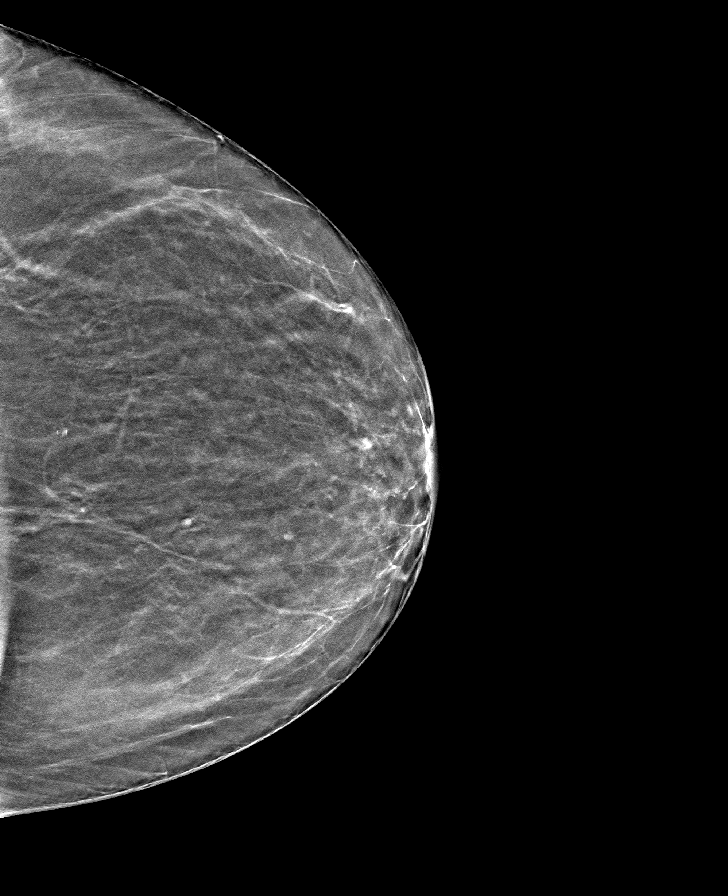

[8 of 24 positions shown; findings below may reference images not displayed]

ACR Breast Density Category b: There are scattered areas of
fibroglandular density.
FINDINGS: In the left breast, calcifications warrant further evaluation. In
the right breast, no findings suspicious for malignancy. Images were
processed with CAD.
IMPRESSION: Further evaluation is suggested for calcifications in the left
breast.

RECOMMENDATION:
Diagnostic mammogram of the left breast. (Code:[id number])

The patient will be contacted regarding the findings, and additional
imaging will be scheduled.

BI-RADS CATEGORY  0: Incomplete. Need additional imaging evaluation
and/or prior mammograms for comparison.

## 2020-12-13 DIAGNOSIS — Z23 Encounter for immunization: Secondary | ICD-10-CM | POA: Diagnosis not present

## 2021-03-03 DIAGNOSIS — L570 Actinic keratosis: Secondary | ICD-10-CM | POA: Diagnosis not present

## 2021-03-03 DIAGNOSIS — Z85828 Personal history of other malignant neoplasm of skin: Secondary | ICD-10-CM | POA: Diagnosis not present

## 2021-03-03 DIAGNOSIS — D692 Other nonthrombocytopenic purpura: Secondary | ICD-10-CM | POA: Diagnosis not present

## 2021-03-03 DIAGNOSIS — D3617 Benign neoplasm of peripheral nerves and autonomic nervous system of trunk, unspecified: Secondary | ICD-10-CM | POA: Diagnosis not present

## 2021-03-03 DIAGNOSIS — L304 Erythema intertrigo: Secondary | ICD-10-CM | POA: Diagnosis not present

## 2021-03-03 DIAGNOSIS — L821 Other seborrheic keratosis: Secondary | ICD-10-CM | POA: Diagnosis not present

## 2021-03-20 DIAGNOSIS — Z6830 Body mass index (BMI) 30.0-30.9, adult: Secondary | ICD-10-CM | POA: Diagnosis not present

## 2021-03-20 DIAGNOSIS — N390 Urinary tract infection, site not specified: Secondary | ICD-10-CM | POA: Diagnosis not present

## 2021-03-20 DIAGNOSIS — R300 Dysuria: Secondary | ICD-10-CM | POA: Diagnosis not present

## 2021-04-07 ENCOUNTER — Other Ambulatory Visit: Payer: Self-pay | Admitting: Internal Medicine

## 2021-04-07 DIAGNOSIS — Z1231 Encounter for screening mammogram for malignant neoplasm of breast: Secondary | ICD-10-CM

## 2021-05-12 ENCOUNTER — Ambulatory Visit
Admission: RE | Admit: 2021-05-12 | Discharge: 2021-05-12 | Disposition: A | Discharge status: Home / Self Care | Payer: Medicare Other | Source: Ambulatory Visit | Attending: Internal Medicine | Admitting: Internal Medicine

## 2021-05-12 DIAGNOSIS — Z1231 Encounter for screening mammogram for malignant neoplasm of breast: Secondary | ICD-10-CM

## 2021-05-16 DIAGNOSIS — Z23 Encounter for immunization: Secondary | ICD-10-CM | POA: Diagnosis not present

## 2021-06-09 DIAGNOSIS — Z23 Encounter for immunization: Secondary | ICD-10-CM | POA: Diagnosis not present

## 2021-07-02 DIAGNOSIS — R82998 Other abnormal findings in urine: Secondary | ICD-10-CM | POA: Diagnosis not present

## 2021-07-02 DIAGNOSIS — R030 Elevated blood-pressure reading, without diagnosis of hypertension: Secondary | ICD-10-CM | POA: Diagnosis not present

## 2021-07-02 DIAGNOSIS — N390 Urinary tract infection, site not specified: Secondary | ICD-10-CM | POA: Diagnosis not present

## 2021-07-02 DIAGNOSIS — R300 Dysuria: Secondary | ICD-10-CM | POA: Diagnosis not present

## 2021-09-29 NOTE — Progress Notes (Signed)
GYNECOLOGY  VISIT ?  ?HPI: ?71 y.o.   Married White or Caucasian Not Hispanic or Latino  female   ?[id number] with No LMP recorded. Patient is postmenopausal.   ?here for   UTI check. She c/o a one day h/o urinary frequency, feels she can't empty her bladder, voiding small amounts, no burning but has some discomfort with voiding. ?No fevers, no flank pain.  ? ?She has had many bladder infection. She thinks her last UTI was 4-5 months ago. 3 UTI's in the last year, better than they have been. She thinks her UTI's are related to intercourse.  ? ?No h/o breast cancer or clotting issues. ?Mammogram is UTD. ? ?She does have some dryness with intercourse, they haven't been using a lubricant. ? ? ?GYNECOLOGIC HISTORY: ?No LMP recorded. Patient is postmenopausal. ?Contraception:PMP ?Menopausal hormone therapy: None ?       ?OB History   ? ? Gravida  ?3  ? Para  ?2  ? Term  ?2  ? Preterm  ?   ? AB  ?1  ? Living  ?2  ?  ? ? SAB  ?1  ? IAB  ?   ? Ectopic  ?   ? Multiple  ?   ? Live Births  ?   ?   ?  ?  ?    ? ?Patient Active Problem List  ? Diagnosis Date Noted  ? S/P TKR (total knee replacement) 06/11/2018  ? Primary localized osteoarthritis of right knee 05/27/2018  ? Pes planus of both feet 10/08/2015  ? Posterior tibialis tendon insufficiency 10/08/2015  ? Abnormality of gait 10/08/2015  ? History of recurrent UTIs 02/26/2013  ? Arthritis   ? ? ?Past Medical History:  ?Diagnosis Date  ? Arthritis   ? Diverticulosis   ? GERD (gastroesophageal reflux disease)   ? Headache   ? migraines  ? PONV (postoperative nausea and vomiting)   ? Primary localized osteoarthritis of knee   ? right  ? Primary localized osteoarthritis of right knee 05/27/2018  ? Wears glasses   ? ? ?Past Surgical History:  ?Procedure Laterality Date  ? COLONOSCOPY    ? KNEE SURGERY    ? TOTAL KNEE ARTHROPLASTY Right 06/10/2018  ? Procedure: TOTAL KNEE ARTHROPLASTY;  Surgeon: Elsie Saas, MD;  Location: [facility];  Service: Orthopedics;  Laterality: Right;  ?  TUBAL LIGATION    ? ? ?Current Outpatient Medications  ?Medication Sig Dispense Refill  ? acetaminophen (TYLENOL) 500 MG tablet Take 1,000 mg by mouth every 6 (six) hours as needed for moderate pain.    ? ?No current facility-administered medications for this visit.  ?  ? ?ALLERGIES: Bactrim [sulfamethoxazole-trimethoprim] ? ?Family History  ?Problem Relation Age of Onset  ? Cancer Mother   ?     LUNG  ? Hypertension Mother   ? Osteoporosis Father   ? Cancer Father   ?     BLADDER  ? Osteoporosis Paternal Grandmother   ? Cancer Maternal Aunt   ?     Lung cancer  ? ? ?Social History  ? ?Socioeconomic History  ? Marital status: Married  ?  Spouse name: Not on file  ? Number of children: Not on file  ? Years of education: Not on file  ? Highest education level: Not on file  ?Occupational History  ? Not on file  ?Tobacco Use  ? Smoking status: Never  ? Smokeless tobacco: Never  ?Vaping Use  ? Vaping Use: Never used  ?  Substance and Sexual Activity  ? Alcohol use: Yes  ?  Comment: occasional  ? Drug use: Never  ? Sexual activity: Yes  ?  Birth control/protection: Post-menopausal  ?Other Topics Concern  ? Not on file  ?Social History Narrative  ? Not on file  ? ?Social Determinants of Health  ? ?Financial Resource Strain: Not on file  ?Food Insecurity: Not on file  ?Transportation Needs: Not on file  ?Physical Activity: Not on file  ?Stress: Not on file  ?Social Connections: Not on file  ?Intimate Partner Violence: Not on file  ? ? ?ROS ? ?PHYSICAL EXAMINATION:   ? ?BP 120/78 (BP Location: Right Arm, Patient Position: Sitting, Cuff Size: Normal)     ?General appearance: alert, cooperative and appears stated age ?CVA: not tender ?Abdomen: soft, non-tender; non distended, no masses,  no organomegaly ? ?1. Cystitis ?- Urinalysis,Complete w/RFL Culture ?- Urine Culture ?- nitrofurantoin, macrocrystal-monohydrate, (MACROBID) 100 MG capsule; Take 1 capsule (100 mg total) by mouth 2 (two) times daily.  Dispense: 10 capsule;  Refill: 0 ?- phenazopyridine (PYRIDIUM) 200 MG tablet; Take 1 tablet (200 mg total) by mouth 3 (three) times daily with meals.  Dispense: 6 tablet; Refill: 0 ?-She has a h/o recurrent UTI's, we discussed voiding after intercourse, staying well hydrated. We discussed the option of vaginal estrogen, she will let me know if she wants to try this ? ?2. Vaginal dryness ?Samples of uberlube given ?Discussed vaginal estrogen ? ? ?

## 2021-09-30 ENCOUNTER — Ambulatory Visit (INDEPENDENT_AMBULATORY_CARE_PROVIDER_SITE_OTHER): Payer: Medicare Other | Admitting: Obstetrics and Gynecology

## 2021-09-30 ENCOUNTER — Encounter: Payer: Self-pay | Admitting: Obstetrics and Gynecology

## 2021-09-30 VITALS — BP 120/78

## 2021-09-30 DIAGNOSIS — N3090 Cystitis, unspecified without hematuria: Secondary | ICD-10-CM

## 2021-09-30 DIAGNOSIS — N898 Other specified noninflammatory disorders of vagina: Secondary | ICD-10-CM | POA: Diagnosis not present

## 2021-09-30 MED ORDER — NITROFURANTOIN MONOHYD MACRO 100 MG PO CAPS: 100.0000 mg | ORAL_CAPSULE | Freq: Two times a day (BID) | ORAL | 0 refills | Status: DC

## 2021-09-30 MED ORDER — PHENAZOPYRIDINE HCL 200 MG PO TABS: 200.0000 mg | ORAL_TABLET | Freq: Three times a day (TID) | ORAL | 0 refills | Status: DC

## 2021-09-30 NOTE — Patient Instructions (Signed)
Option to try vaginal estrogen to decrease recurrent bladder infections.  ? ?Atrophic Vaginitis ? ?Atrophic vaginitis is a condition in which the tissues that line the vagina become dry and thin. This condition is most common in women who have stopped having regular menstrual periods (are in menopause). This usually starts when a woman is 44 to 73 years old. That is the time when a woman's estrogen levels begin to decrease. ?Estrogen is a female hormone. It helps to keep the tissues of the vagina moist. It stimulates the vagina to produce a clear fluid that lubricates the vagina for sex. This fluid also protects the vagina from infection. Lack of estrogen can cause the lining of the vagina to get thinner and dryer. The vagina may also shrink in size. It may become less elastic. Atrophic vaginitis tends to get worse over time as a woman's estrogen level drops. ?What are the causes? ?This condition is caused by the normal drop in estrogen that happens around the time of menopause. ?What increases the risk? ?Certain conditions or situations may lower a woman's estrogen level, leading to a higher risk for atrophic vaginitis. You are more likely to develop this condition if: ?You are taking medicines that block estrogen. ?You have had your ovaries removed. ?You are being treated for cancer with radiation or medicines (chemotherapy). ?You have given birth or are breastfeeding. ?You are older than age 73. ?You smoke. ?What are the signs or symptoms? ?Symptoms of this condition include: ?Pain, soreness, a feeling of pressure, or bleeding during sex (dyspareunia). ?Vaginal burning, irritation, or itching. ?Pain or bleeding when a speculum is used in a vaginal exam. ?Having burning pain while urinating. ?Vaginal discharge. ?In some cases, there are no symptoms. ?How is this diagnosed? ?This condition is diagnosed based on your medical history and a physical exam. This will include a pelvic exam that checks the vaginal  tissues. Though rare, you may also have other tests, including: ?A urine test. ?A test that checks the acid balance in your vagina (acid balance test). ?How is this treated? ?Treatment for this condition depends on how severe your symptoms are. Treatment may include: ?Using an over-the-counter vaginal lubricant before sex. ?Using a long-acting vaginal moisturizer. ?Using low-dose estrogen for moderate to severe symptoms that do not respond to other treatments. Options include creams, tablets, and inserts (vaginal rings). Before you use a vaginal estrogen, tell your health care provider if you have a history of: ?Breast cancer. ?Endometrial cancer. ?Blood clots. ?If you are not sexually active and your symptoms are very mild, you may not need treatment. ?Follow these instructions at home: ?Medicines ?Take over-the-counter and prescription medicines only as told by your health care provider. ?Do not use herbal or alternative medicines unless your health care provider says that you can. ?Use over-the-counter creams, lubricants, or moisturizers for dryness only as told by your health care provider. ?General instructions ?If your atrophic vaginitis is caused by menopause, discuss all of your menopause symptoms and treatment options with your health care provider. ?Do not douche. ?Do not use products that can make your vagina dry. These include: ?Scented feminine sprays. ?Scented tampons. ?Scented soaps. ?Vaginal sex can help to improve blood flow and elasticity of vaginal tissue. If you choose to have sex and it hurts, try using a water-soluble lubricant or moisturizer right before having sex. ?Contact a health care provider if: ?Your discharge looks different than normal. ?Your vagina has an unusual smell. ?You have new symptoms. ?Your symptoms do not improve  with treatment. ?Your symptoms get worse. ?Summary ?Atrophic vaginitis is a condition in which the tissues that line the vagina become dry and thin. It is most  common in women who have stopped having regular menstrual periods (are in menopause). ?Treatment options include using vaginal lubricants and low-dose vaginal estrogen. ?Contact a health care provider if your vagina has an unusual smell, or if your symptoms get worse or do not improve after treatment. ?This information is not intended to replace advice given to you by your health care provider. Make sure you discuss any questions you have with your health care provider. ?Document Revised: 11/20/2019 Document Reviewed: 11/20/2019 ?Elsevier Patient Education ? [employer] [place]. ?Urinary Tract Infection, Adult ? ?A urinary tract infection (UTI) is an infection of any part of the urinary tract. The urinary tract includes the kidneys, ureters, bladder, and urethra. These organs make, store, and get rid of urine in the body. ?An upper UTI affects the ureters and kidneys. A lower UTI affects the bladder and urethra. ?What are the causes? ?Most urinary tract infections are caused by bacteria in your genital area around your urethra, where urine leaves your body. These bacteria grow and cause inflammation of your urinary tract. ?What increases the risk? ?You are more likely to develop this condition if: ?You have a urinary catheter that stays in place. ?You are not able to control when you urinate or have a bowel movement (incontinence). ?You are female and you: ?Use a spermicide or diaphragm for birth control. ?Have low estrogen levels. ?Are pregnant. ?You have certain genes that increase your risk. ?You are sexually active. ?You take antibiotic medicines. ?You have a condition that causes your flow of urine to slow down, such as: ?An enlarged prostate, if you are female. ?Blockage in your urethra. ?A kidney stone. ?A nerve condition that affects your bladder control (neurogenic bladder). ?Not getting enough to drink, or not urinating often. ?You have certain medical conditions, such as: ?Diabetes. ?A weak disease-fighting  system (immunesystem). ?Sickle cell disease. ?Gout. ?Spinal cord injury. ?What are the signs or symptoms? ?Symptoms of this condition include: ?Needing to urinate right away (urgency). ?Frequent urination. This may include small amounts of urine each time you urinate. ?Pain or burning with urination. ?Blood in the urine. ?Urine that smells bad or unusual. ?Trouble urinating. ?Cloudy urine. ?Vaginal discharge, if you are female. ?Pain in the abdomen or the lower back. ?You may also have: ?Vomiting or a decreased appetite. ?Confusion. ?Irritability or tiredness. ?A fever or chills. ?Diarrhea. ?The first symptom in older adults may be confusion. In some cases, they may not have any symptoms until the infection has worsened. ?How is this diagnosed? ?This condition is diagnosed based on your medical history and a physical exam. You may also have other tests, including: ?Urine tests. ?Blood tests. ?Tests for STIs (sexually transmitted infections). ?If you have had more than one UTI, a cystoscopy or imaging studies may be done to determine the cause of the infections. ?How is this treated? ?Treatment for this condition includes: ?Antibiotic medicine. ?Over-the-counter medicines to treat discomfort. ?Drinking enough water to stay hydrated. ?If you have frequent infections or have other conditions such as a kidney stone, you may need to see a health care provider who specializes in the urinary tract (urologist). ?In rare cases, urinary tract infections can cause sepsis. Sepsis is a life-threatening condition that occurs when the body responds to an infection. Sepsis is treated in the hospital with IV antibiotics, fluids, and other medicines. ?Follow  these instructions at home: ? ?Medicines ?Take over-the-counter and prescription medicines only as told by your health care provider. ?If you were prescribed an antibiotic medicine, take it as told by your health care provider. Do not stop using the antibiotic even if you start  to feel better. ?General instructions ?Make sure you: ?Empty your bladder often and completely. Do not hold urine for long periods of time. ?Empty your bladder after sex. ?Wipe from front to back after urinating

## 2021-09-30 NOTE — Addendum Note (Signed)
Addended by: Joaquin Music on: 09/30/2021 11:18 AM ? ? Modules accepted: Orders ? ?

## 2021-10-02 LAB — URINALYSIS, COMPLETE W/RFL CULTURE
Bilirubin Urine: NEGATIVE
Glucose, UA: NEGATIVE
Hyaline Cast: NONE SEEN /LPF
Ketones, ur: NEGATIVE
Nitrites, Initial: NEGATIVE
Specific Gravity, Urine: 1.015 (ref 1.001–1.035)
pH: 6 (ref 5.0–8.0)

## 2021-10-02 LAB — URINE CULTURE
MICRO NUMBER:: 13326391
SPECIMEN QUALITY:: ADEQUATE

## 2021-10-02 LAB — CULTURE INDICATED

## 2021-10-03 ENCOUNTER — Other Ambulatory Visit: Payer: Self-pay

## 2021-10-03 MED ORDER — CEPHALEXIN 500 MG PO CAPS: 500.0000 mg | ORAL_CAPSULE | Freq: Two times a day (BID) | ORAL | 0 refills | Status: DC

## 2021-12-12 ENCOUNTER — Ambulatory Visit (INDEPENDENT_AMBULATORY_CARE_PROVIDER_SITE_OTHER): Payer: Medicare Other | Admitting: Obstetrics and Gynecology

## 2021-12-12 ENCOUNTER — Encounter: Payer: Self-pay | Admitting: Obstetrics and Gynecology

## 2021-12-12 VITALS — BP 110/72 | HR 67 | Temp 97.8°F | Wt 182.0 lb

## 2021-12-12 DIAGNOSIS — N3090 Cystitis, unspecified without hematuria: Secondary | ICD-10-CM

## 2021-12-12 DIAGNOSIS — Z87440 Personal history of urinary (tract) infections: Secondary | ICD-10-CM | POA: Diagnosis not present

## 2021-12-12 LAB — URINALYSIS, COMPLETE
Bilirubin Urine: NEGATIVE
Casts: NONE SEEN /LPF
Crystals: NONE SEEN /HPF
Glucose, UA: NEGATIVE
Hyaline Cast: NONE SEEN /LPF
Ketones, ur: NEGATIVE
Nitrite: POSITIVE — AB
Specific Gravity, Urine: 1.015 (ref 1.001–1.035)
Yeast: NONE SEEN /HPF
pH: 5.5 (ref 5.0–8.0)

## 2021-12-12 MED ORDER — NITROFURANTOIN MONOHYD MACRO 100 MG PO CAPS
100.0000 mg | ORAL_CAPSULE | Freq: Two times a day (BID) | ORAL | 0 refills | Status: DC
Start: 2021-12-12 — End: 2023-04-11

## 2021-12-12 MED ORDER — PHENAZOPYRIDINE HCL 200 MG PO TABS: 200.0000 mg | ORAL_TABLET | Freq: Three times a day (TID) | ORAL | 0 refills | Status: AC

## 2021-12-12 MED ORDER — NITROFURANTOIN MACROCRYSTAL 50 MG PO CAPS: ORAL_CAPSULE | ORAL | 2 refills | Status: AC

## 2021-12-12 NOTE — Progress Notes (Signed)
GYNECOLOGY  VISIT   HPI: 71 y.o.   Married White or Caucasian Not Hispanic or Latino  female   [phone] with No LMP recorded. Patient is postmenopausal.   here for frequency and urgency.  She took pyridium for the past 2 days but none since Sunday. She c/o a 4 day h/o urinary frequency and urgency to void. Voiding small amounts sometimes. No dysuria.    She had a UTI in 4/23. At that visit she had reported 3 UTI's in the preceding year. Thinks the UTI's all come after intercourse.  No fevers, no flank pain   GYNECOLOGIC HISTORY: No LMP recorded. Patient is postmenopausal. Contraception:pmp  Menopausal hormone therapy: none         OB History     Gravida  3   Para  2   Term  2   Preterm      AB  1   Living  2      SAB  1   IAB      Ectopic      Multiple      Live Births                 Patient Active Problem List   Diagnosis Date Noted   S/P TKR (total knee replacement) 06/11/2018   Primary localized osteoarthritis of right knee 05/27/2018   Pes planus of both feet 10/08/2015   Posterior tibialis tendon insufficiency 10/08/2015   Abnormality of gait 10/08/2015   History of recurrent UTIs 02/26/2013   Arthritis     Past Medical History:  Diagnosis Date   Arthritis    Diverticulosis    GERD (gastroesophageal reflux disease)    Headache    migraines   PONV (postoperative nausea and vomiting)    Primary localized osteoarthritis of knee    right   Primary localized osteoarthritis of right knee 05/27/2018   Wears glasses     Past Surgical History:  Procedure Laterality Date   COLONOSCOPY     KNEE SURGERY     TOTAL KNEE ARTHROPLASTY Right 06/10/2018   Procedure: TOTAL KNEE ARTHROPLASTY;  Surgeon: Elsie Saas, MD;  Location: [facility];  Service: Orthopedics;  Laterality: Right;   TUBAL LIGATION      Current Outpatient Medications  Medication Sig Dispense Refill   acetaminophen (TYLENOL) 500 MG tablet Take 1,000 mg by mouth every 6 (six) hours as  needed for moderate pain.     No current facility-administered medications for this visit.     ALLERGIES: Bactrim [sulfamethoxazole-trimethoprim]  Family History  Problem Relation Age of Onset   Cancer Mother        LUNG   Hypertension Mother    Osteoporosis Father    Cancer Father        BLADDER   Osteoporosis Paternal Grandmother    Cancer Maternal Aunt        Lung cancer    Social History   Socioeconomic History   Marital status: Married    Spouse name: Not on file   Number of children: Not on file   Years of education: Not on file   Highest education level: Not on file  Occupational History   Not on file  Tobacco Use   Smoking status: Never   Smokeless tobacco: Never  Vaping Use   Vaping Use: Never used  Substance and Sexual Activity   Alcohol use: Yes    Comment: occasional   Drug use: Never   Sexual activity: Yes  Birth control/protection: Post-menopausal  Other Topics Concern   Not on file  Social History Narrative   Not on file   Social Determinants of Health   Financial Resource Strain: Not on file  Food Insecurity: Not on file  Transportation Needs: Not on file  Physical Activity: Not on file  Stress: Not on file  Social Connections: Not on file  Intimate Partner Violence: Not on file    Review of Systems  Genitourinary:  Positive for frequency and urgency.  All other systems reviewed and are negative.   PHYSICAL EXAMINATION:    BP 110/72   Pulse 67   Temp 97.8 F (36.6 C)   Wt 182 lb (82.6 kg)   SpO2 100%   BMI 26.88 kg/m     General appearance: alert, cooperative and appears stated age Abdomen: soft, non-tender; non distended, no masses,  no organomegaly CVA: not tender  1. Cystitis - Urinalysis, Complete - Urine Culture - phenazopyridine (PYRIDIUM) 200 MG tablet; Take 1 tablet (200 mg total) by mouth 3 (three) times daily with meals.  Dispense: 6 tablet; Refill: 0 - nitrofurantoin, macrocrystal-monohydrate, (MACROBID) 100  MG capsule; Take 1 capsule (100 mg total) by mouth 2 (two) times daily.  Dispense: 10 capsule; Refill: 0  2. History of recurrent UTIs - nitrofurantoin (MACRODANTIN) 50 MG capsule; Take one tablet po prn intercourse  Dispense: 30 capsule; Refill: 2

## 2021-12-12 NOTE — Patient Instructions (Signed)
Urinary Tract Infection, Adult  A urinary tract infection (UTI) is an infection of any part of the urinary tract. The urinary tract includes the kidneys, ureters, bladder, and urethra. These organs make, store, and get rid of urine in the body. An upper UTI affects the ureters and kidneys. A lower UTI affects the bladder and urethra. What are the causes? Most urinary tract infections are caused by bacteria in your genital area around your urethra, where urine leaves your body. These bacteria grow and cause inflammation of your urinary tract. What increases the risk? You are more likely to develop this condition if: You have a urinary catheter that stays in place. You are not able to control when you urinate or have a bowel movement (incontinence). You are female and you: Use a spermicide or diaphragm for birth control. Have low estrogen levels. Are pregnant. You have certain genes that increase your risk. You are sexually active. You take antibiotic medicines. You have a condition that causes your flow of urine to slow down, such as: An enlarged prostate, if you are female. Blockage in your urethra. A kidney stone. A nerve condition that affects your bladder control (neurogenic bladder). Not getting enough to drink, or not urinating often. You have certain medical conditions, such as: Diabetes. A weak disease-fighting system (immunesystem). Sickle cell disease. Gout. Spinal cord injury. What are the signs or symptoms? Symptoms of this condition include: Needing to urinate right away (urgency). Frequent urination. This may include small amounts of urine each time you urinate. Pain or burning with urination. Blood in the urine. Urine that smells bad or unusual. Trouble urinating. Cloudy urine. Vaginal discharge, if you are female. Pain in the abdomen or the lower back. You may also have: Vomiting or a decreased appetite. Confusion. Irritability or tiredness. A fever or  chills. Diarrhea. The first symptom in older adults may be confusion. In some cases, they may not have any symptoms until the infection has worsened. How is this diagnosed? This condition is diagnosed based on your medical history and a physical exam. You may also have other tests, including: Urine tests. Blood tests. Tests for STIs (sexually transmitted infections). If you have had more than one UTI, a cystoscopy or imaging studies may be done to determine the cause of the infections. How is this treated? Treatment for this condition includes: Antibiotic medicine. Over-the-counter medicines to treat discomfort. Drinking enough water to stay hydrated. If you have frequent infections or have other conditions such as a kidney stone, you may need to see a health care provider who specializes in the urinary tract (urologist). In rare cases, urinary tract infections can cause sepsis. Sepsis is a life-threatening condition that occurs when the body responds to an infection. Sepsis is treated in the hospital with IV antibiotics, fluids, and other medicines. Follow these instructions at home:  Medicines Take over-the-counter and prescription medicines only as told by your health care provider. If you were prescribed an antibiotic medicine, take it as told by your health care provider. Do not stop using the antibiotic even if you start to feel better. General instructions Make sure you: Empty your bladder often and completely. Do not hold urine for long periods of time. Empty your bladder after sex. Wipe from front to back after urinating or having a bowel movement if you are female. Use each tissue only one time when you wipe. Drink enough fluid to keep your urine pale yellow. Keep all follow-up visits. This is important. Contact a health   care provider if: Your symptoms do not get better after 1-2 days. Your symptoms go away and then return. Get help right away if: You have severe pain in  your back or your lower abdomen. You have a fever or chills. You have nausea or vomiting. Summary A urinary tract infection (UTI) is an infection of any part of the urinary tract, which includes the kidneys, ureters, bladder, and urethra. Most urinary tract infections are caused by bacteria in your genital area. Treatment for this condition often includes antibiotic medicines. If you were prescribed an antibiotic medicine, take it as told by your health care provider. Do not stop using the antibiotic even if you start to feel better. Keep all follow-up visits. This is important. This information is not intended to replace advice given to you by your health care provider. Make sure you discuss any questions you have with your health care provider. Document Revised: 01/02/2020 Document Reviewed: 01/02/2020 Elsevier Patient Education  2023 [employer]  

## 2021-12-15 LAB — URINE CULTURE
MICRO NUMBER:: 13624825
SPECIMEN QUALITY:: ADEQUATE

## 2022-01-27 DIAGNOSIS — R21 Rash and other nonspecific skin eruption: Secondary | ICD-10-CM | POA: Diagnosis not present

## 2022-01-30 DIAGNOSIS — R1310 Dysphagia, unspecified: Secondary | ICD-10-CM | POA: Diagnosis not present

## 2022-01-30 DIAGNOSIS — Z1211 Encounter for screening for malignant neoplasm of colon: Secondary | ICD-10-CM | POA: Diagnosis not present

## 2022-01-30 DIAGNOSIS — R053 Chronic cough: Secondary | ICD-10-CM | POA: Diagnosis not present

## 2022-02-02 DIAGNOSIS — R1310 Dysphagia, unspecified: Secondary | ICD-10-CM | POA: Diagnosis not present

## 2022-02-02 DIAGNOSIS — K449 Diaphragmatic hernia without obstruction or gangrene: Secondary | ICD-10-CM | POA: Diagnosis not present

## 2022-02-02 DIAGNOSIS — K222 Esophageal obstruction: Secondary | ICD-10-CM | POA: Diagnosis not present

## 2022-02-13 DIAGNOSIS — L309 Dermatitis, unspecified: Secondary | ICD-10-CM | POA: Diagnosis not present

## 2022-02-13 DIAGNOSIS — L821 Other seborrheic keratosis: Secondary | ICD-10-CM | POA: Diagnosis not present

## 2022-02-13 DIAGNOSIS — D485 Neoplasm of uncertain behavior of skin: Secondary | ICD-10-CM | POA: Diagnosis not present

## 2022-02-13 DIAGNOSIS — D3617 Benign neoplasm of peripheral nerves and autonomic nervous system of trunk, unspecified: Secondary | ICD-10-CM | POA: Diagnosis not present

## 2022-02-13 DIAGNOSIS — Z85828 Personal history of other malignant neoplasm of skin: Secondary | ICD-10-CM | POA: Diagnosis not present

## 2022-02-22 DIAGNOSIS — L509 Urticaria, unspecified: Secondary | ICD-10-CM | POA: Diagnosis not present

## 2022-02-22 DIAGNOSIS — L500 Allergic urticaria: Secondary | ICD-10-CM | POA: Diagnosis not present

## 2022-02-24 DIAGNOSIS — R7301 Impaired fasting glucose: Secondary | ICD-10-CM | POA: Diagnosis not present

## 2022-02-24 DIAGNOSIS — E669 Obesity, unspecified: Secondary | ICD-10-CM | POA: Diagnosis not present

## 2022-02-24 DIAGNOSIS — R21 Rash and other nonspecific skin eruption: Secondary | ICD-10-CM | POA: Diagnosis not present

## 2022-02-24 DIAGNOSIS — E7800 Pure hypercholesterolemia, unspecified: Secondary | ICD-10-CM | POA: Diagnosis not present

## 2022-02-24 DIAGNOSIS — R1310 Dysphagia, unspecified: Secondary | ICD-10-CM | POA: Diagnosis not present

## 2022-02-24 DIAGNOSIS — R030 Elevated blood-pressure reading, without diagnosis of hypertension: Secondary | ICD-10-CM | POA: Diagnosis not present

## 2022-03-10 DIAGNOSIS — R21 Rash and other nonspecific skin eruption: Secondary | ICD-10-CM | POA: Diagnosis not present

## 2022-03-16 DIAGNOSIS — D485 Neoplasm of uncertain behavior of skin: Secondary | ICD-10-CM | POA: Diagnosis not present

## 2022-03-16 DIAGNOSIS — L570 Actinic keratosis: Secondary | ICD-10-CM | POA: Diagnosis not present

## 2022-03-16 DIAGNOSIS — L304 Erythema intertrigo: Secondary | ICD-10-CM | POA: Diagnosis not present

## 2022-03-16 DIAGNOSIS — Z85828 Personal history of other malignant neoplasm of skin: Secondary | ICD-10-CM | POA: Diagnosis not present

## 2022-03-16 DIAGNOSIS — L309 Dermatitis, unspecified: Secondary | ICD-10-CM | POA: Diagnosis not present

## 2022-04-13 ENCOUNTER — Other Ambulatory Visit: Payer: Self-pay | Admitting: Internal Medicine

## 2022-04-13 DIAGNOSIS — Z1231 Encounter for screening mammogram for malignant neoplasm of breast: Secondary | ICD-10-CM

## 2022-05-15 ENCOUNTER — Ambulatory Visit: Payer: Medicare Other

## 2022-06-13 ENCOUNTER — Ambulatory Visit
Admission: RE | Admit: 2022-06-13 | Discharge: 2022-06-13 | Disposition: A | Discharge status: Home / Self Care | Payer: Medicare Other | Source: Ambulatory Visit | Attending: Internal Medicine | Admitting: Internal Medicine

## 2022-06-13 DIAGNOSIS — Z1231 Encounter for screening mammogram for malignant neoplasm of breast: Secondary | ICD-10-CM

## 2022-07-03 ENCOUNTER — Ambulatory Visit: Payer: Medicare Other

## 2023-01-25 DIAGNOSIS — T148XXA Other injury of unspecified body region, initial encounter: Secondary | ICD-10-CM | POA: Diagnosis not present

## 2023-01-25 DIAGNOSIS — R0789 Other chest pain: Secondary | ICD-10-CM | POA: Diagnosis not present

## 2023-04-10 ENCOUNTER — Other Ambulatory Visit: Payer: Self-pay

## 2023-04-10 DIAGNOSIS — Z87440 Personal history of urinary (tract) infections: Secondary | ICD-10-CM

## 2023-04-10 NOTE — Telephone Encounter (Signed)
Pt LVM in triage line requesting refills on macrobid 50mg  that she has been prescribed in the past prn for frequent UTIs.  Pt has not been seen for an AEX w/ office since 2017 w/ [place],NP. Since then, has only been seen for OV's (last seen on 12/12/2021 w/ JJ)  Pt also left in VM that she is experiencing UTI sxs currently.  Please confirm that pt needs an OV prior to prescription medication?

## 2023-04-11 ENCOUNTER — Ambulatory Visit (INDEPENDENT_AMBULATORY_CARE_PROVIDER_SITE_OTHER): Payer: Medicare Other | Admitting: Nurse Practitioner

## 2023-04-11 ENCOUNTER — Telehealth: Payer: Self-pay

## 2023-04-11 ENCOUNTER — Encounter: Payer: Self-pay | Admitting: Nurse Practitioner

## 2023-04-11 VITALS — BP 120/64 | HR 79 | Wt 188.0 lb

## 2023-04-11 DIAGNOSIS — R350 Frequency of micturition: Secondary | ICD-10-CM

## 2023-04-11 DIAGNOSIS — N3001 Acute cystitis with hematuria: Secondary | ICD-10-CM

## 2023-04-11 MED ORDER — NITROFURANTOIN MONOHYD MACRO 100 MG PO CAPS: 100.0000 mg | ORAL_CAPSULE | Freq: Two times a day (BID) | ORAL | 0 refills | Status: DC

## 2023-04-11 NOTE — Telephone Encounter (Signed)
Patient called stating she is having sharp pains on both sides of her lower abdomen. She wants to know if this is normal with uti. Please advise.

## 2023-04-11 NOTE — Telephone Encounter (Signed)
Yes, it can be. Needs to start antibiotic as soon as possible if she has not already.

## 2023-04-11 NOTE — Progress Notes (Addendum)
   Acute Office Visit  Subjective:    Patient ID: Mary Daniel, female    DOB: February 21, 1951, 72 y.o.   MRN: [id number]   HPI 72 y.o. presents today for urinary frequency and back pain x 3 days. H/O recurrent/postcoital UTIs, takes Macrodantin as needed. Had a couple of doses at home, so she took them when symptoms started.   No LMP recorded. Patient is postmenopausal.    Review of Systems  Constitutional: Negative.   Genitourinary:  Positive for flank pain and frequency. Negative for difficulty urinating, dysuria, hematuria, pelvic pain and urgency.       Objective:    Physical Exam Constitutional:      Appearance: Normal appearance.   GU: Not indicated  BP 120/64   Pulse 79   Wt 188 lb (85.3 kg)   SpO2 98%   BMI 27.76 kg/m  Wt Readings from Last 3 Encounters:  04/11/23 188 lb (85.3 kg)  12/12/21 182 lb (82.6 kg)  06/11/18 177 lb 14.6 oz (80.7 kg)        UA: leukocytes 3+, neg nitrites, 2+ blood, 2+ protein, yellow/cloudy. Microscopic: wbc 40-60, rbc 10-20, moderate bacteria  Assessment & Plan:   Problem List Items Addressed This Visit   None Visit Diagnoses     Acute cystitis with hematuria    -  Primary   Relevant Medications   nitrofurantoin, macrocrystal-monohydrate, (MACROBID) 100 MG capsule   Urinary frequency       Relevant Orders   Urinalysis,Complete w/RFL Culture      Plan: Acute cystitis - Macrobid 100 mg BID x 7 days. Culture pending. Increase water intake.   Return if symptoms worsen or fail to improve.    Olivia Mackie DNP, 12:15 PM 04/11/2023

## 2023-04-11 NOTE — Telephone Encounter (Signed)
No openings today for BS.   Pt scheduled with TW for today @ 1145 arrival at 1130.  Routing to providers for final review and closing encounter.

## 2023-04-11 NOTE — Telephone Encounter (Signed)
Please schedule an appointment for patient to be seen today.   She needs on office visit to receive a prescription.

## 2023-04-11 NOTE — Addendum Note (Signed)
Addended byWyline Beady on: 04/11/2023 12:16 PM   Modules accepted: Orders

## 2023-04-12 NOTE — Telephone Encounter (Signed)
Patient advised. She says that she has started her antibiotics and took some tylenol and is feeling some better.

## 2023-04-13 LAB — URINALYSIS, COMPLETE W/RFL CULTURE
Bilirubin Urine: NEGATIVE
Glucose, UA: NEGATIVE
Hyaline Cast: NONE SEEN /[LPF]
Ketones, ur: NEGATIVE
Nitrites, Initial: NEGATIVE
Specific Gravity, Urine: 1.015 (ref 1.001–1.035)
pH: 6.5 (ref 5.0–8.0)

## 2023-04-13 LAB — CULTURE INDICATED

## 2023-04-13 LAB — URINE CULTURE
MICRO NUMBER:: 15694150
SPECIMEN QUALITY:: ADEQUATE

## 2023-05-11 ENCOUNTER — Other Ambulatory Visit: Payer: Self-pay | Admitting: Internal Medicine

## 2023-05-11 DIAGNOSIS — Z1231 Encounter for screening mammogram for malignant neoplasm of breast: Secondary | ICD-10-CM

## 2023-06-18 ENCOUNTER — Ambulatory Visit: Payer: Medicare Other

## 2023-06-23 DIAGNOSIS — R059 Cough, unspecified: Secondary | ICD-10-CM | POA: Diagnosis not present

## 2023-07-02 ENCOUNTER — Ambulatory Visit
Admission: RE | Admit: 2023-07-02 | Discharge: 2023-07-02 | Disposition: A | Discharge status: Home / Self Care | Payer: Medicare Other | Source: Ambulatory Visit | Attending: Internal Medicine | Admitting: Internal Medicine

## 2023-07-02 DIAGNOSIS — Z1231 Encounter for screening mammogram for malignant neoplasm of breast: Secondary | ICD-10-CM | POA: Diagnosis not present

## 2023-07-18 DIAGNOSIS — Z23 Encounter for immunization: Secondary | ICD-10-CM | POA: Diagnosis not present

## 2023-07-18 DIAGNOSIS — H9313 Tinnitus, bilateral: Secondary | ICD-10-CM | POA: Diagnosis not present

## 2023-07-18 DIAGNOSIS — R053 Chronic cough: Secondary | ICD-10-CM | POA: Diagnosis not present

## 2023-08-24 DIAGNOSIS — R053 Chronic cough: Secondary | ICD-10-CM | POA: Diagnosis not present

## 2023-08-24 DIAGNOSIS — H9313 Tinnitus, bilateral: Secondary | ICD-10-CM | POA: Diagnosis not present

## 2023-08-24 DIAGNOSIS — H903 Sensorineural hearing loss, bilateral: Secondary | ICD-10-CM | POA: Diagnosis not present

## 2023-12-03 DIAGNOSIS — Z85828 Personal history of other malignant neoplasm of skin: Secondary | ICD-10-CM | POA: Diagnosis not present

## 2023-12-03 DIAGNOSIS — L570 Actinic keratosis: Secondary | ICD-10-CM | POA: Diagnosis not present

## 2023-12-13 ENCOUNTER — Other Ambulatory Visit (HOSPITAL_COMMUNITY): Payer: Self-pay | Admitting: Internal Medicine

## 2023-12-13 DIAGNOSIS — E7800 Pure hypercholesterolemia, unspecified: Secondary | ICD-10-CM

## 2023-12-18 ENCOUNTER — Ambulatory Visit (HOSPITAL_COMMUNITY)
Admission: RE | Admit: 2023-12-18 | Discharge: 2023-12-18 | Disposition: A | Discharge status: Home / Self Care | Payer: Self-pay | Source: Ambulatory Visit | Attending: Internal Medicine | Admitting: Internal Medicine

## 2023-12-18 DIAGNOSIS — E7800 Pure hypercholesterolemia, unspecified: Secondary | ICD-10-CM | POA: Insufficient documentation

## 2024-01-10 DIAGNOSIS — E559 Vitamin D deficiency, unspecified: Secondary | ICD-10-CM | POA: Diagnosis not present

## 2024-01-10 DIAGNOSIS — Z13820 Encounter for screening for osteoporosis: Secondary | ICD-10-CM | POA: Diagnosis not present

## 2024-01-10 DIAGNOSIS — Z0000 Encounter for general adult medical examination without abnormal findings: Secondary | ICD-10-CM | POA: Diagnosis not present

## 2024-01-10 DIAGNOSIS — H9313 Tinnitus, bilateral: Secondary | ICD-10-CM | POA: Diagnosis not present

## 2024-01-10 DIAGNOSIS — E7800 Pure hypercholesterolemia, unspecified: Secondary | ICD-10-CM | POA: Diagnosis not present

## 2024-01-10 DIAGNOSIS — Z79899 Other long term (current) drug therapy: Secondary | ICD-10-CM | POA: Diagnosis not present

## 2024-01-10 DIAGNOSIS — Z23 Encounter for immunization: Secondary | ICD-10-CM | POA: Diagnosis not present

## 2024-01-10 DIAGNOSIS — Z1331 Encounter for screening for depression: Secondary | ICD-10-CM | POA: Diagnosis not present

## 2024-01-10 DIAGNOSIS — M546 Pain in thoracic spine: Secondary | ICD-10-CM | POA: Diagnosis not present

## 2024-01-10 DIAGNOSIS — R030 Elevated blood-pressure reading, without diagnosis of hypertension: Secondary | ICD-10-CM | POA: Diagnosis not present

## 2024-01-11 ENCOUNTER — Other Ambulatory Visit (HOSPITAL_BASED_OUTPATIENT_CLINIC_OR_DEPARTMENT_OTHER): Payer: Self-pay | Admitting: Internal Medicine

## 2024-01-11 DIAGNOSIS — M810 Age-related osteoporosis without current pathological fracture: Secondary | ICD-10-CM

## 2024-02-21 DIAGNOSIS — J069 Acute upper respiratory infection, unspecified: Secondary | ICD-10-CM | POA: Diagnosis not present

## 2024-02-21 DIAGNOSIS — J208 Acute bronchitis due to other specified organisms: Secondary | ICD-10-CM | POA: Diagnosis not present

## 2024-02-26 ENCOUNTER — Ambulatory Visit (INDEPENDENT_AMBULATORY_CARE_PROVIDER_SITE_OTHER): Admitting: Radiology

## 2024-02-26 VITALS — BP 124/78 | HR 87 | Temp 98.4°F | Wt 199.0 lb

## 2024-02-26 DIAGNOSIS — R350 Frequency of micturition: Secondary | ICD-10-CM | POA: Diagnosis not present

## 2024-02-26 DIAGNOSIS — N3001 Acute cystitis with hematuria: Secondary | ICD-10-CM

## 2024-02-26 MED ORDER — NITROFURANTOIN MONOHYD MACRO 100 MG PO CAPS: 100.0000 mg | ORAL_CAPSULE | Freq: Two times a day (BID) | ORAL | 0 refills | Status: AC

## 2024-02-26 NOTE — Progress Notes (Signed)
      SUBJECTIVE: Mary Daniel is a 73 y.o. female who complains urinary frequency, urgency, no dysuria. Symptoms began last week. Has had a bad cough and had to start wearing a pad for leakage which she think contributed.    Allergies  Allergen Reactions   Bactrim  [Sulfamethoxazole -Trimethoprim ] Swelling    Current Outpatient Medications on File Prior to Visit  Medication Sig Dispense Refill   acetaminophen  (TYLENOL ) 500 MG tablet Take 1,000 mg by mouth every 6 (six) hours as needed for moderate pain.     albuterol (VENTOLIN HFA) 108 (90 Base) MCG/ACT inhaler SMARTSIG:1 Puff(s) Via Inhaler Every 4 Hours PRN     benzonatate (TESSALON) 200 MG capsule Take 200 mg by mouth 3 (three) times daily as needed.     nitrofurantoin  (MACRODANTIN ) 50 MG capsule Take one tablet po prn intercourse (Patient not taking: Reported on 02/26/2024) 30 capsule 2   phenazopyridine  (PYRIDIUM ) 200 MG tablet Take 1 tablet (200 mg total) by mouth 3 (three) times daily with meals. (Patient not taking: Reported on 02/26/2024) 6 tablet 0   No current facility-administered medications on file prior to visit.    Past Medical History:  Diagnosis Date   Arthritis    Diverticulosis    GERD (gastroesophageal reflux disease)    Headache    migraines   PONV (postoperative nausea and vomiting)    Primary localized osteoarthritis of knee    right   Primary localized osteoarthritis of right knee 05/27/2018   Wears glasses      OBJECTIVE: Appears well, in no apparent distress.  Vital signs are normal. The abdomen is soft without tenderness, guarding, mass, rebound or organomegaly. No CVA tenderness or inguinal adenopathy noted. Urine dipstick shows positive for RBC's, positive for protein, positive for nitrates, and positive for leukocytes.  Micro exam: 40-60 WBC's per HPF, 3-10 RBC's per HPF, and many+ bacteria.    Blood pressure 124/78, pulse 87, temperature 98.4 F (36.9 C), temperature source Oral, weight 199 lb  (90.3 kg), SpO2 99%.    Chaperone offered and declined for exam.  ASSESSMENT/PLAN: 1. Urinary frequency (Primary) - Urinalysis,Complete w/RFL Culture  2. Acute cystitis with hematuria - nitrofurantoin , macrocrystal-monohydrate, (MACROBID ) 100 MG capsule; Take 1 capsule (100 mg total) by mouth 2 (two) times daily.  Dispense: 14 capsule; Refill: 0    Will send urine culture and sensitivity.  Treatment per orders - also push fluids, avoid bladder irritants. Instructed she may use Pyridium  OTC prn. Call or return to clinic prn if these symptoms worsen or fail to improve as anticipated. Pyelo precautions reviewed with patient.   Dyamond Tolosa B, NP 2:24 PM

## 2024-02-29 ENCOUNTER — Ambulatory Visit: Payer: Self-pay | Admitting: Radiology

## 2024-02-29 LAB — URINALYSIS, COMPLETE W/RFL CULTURE
Bilirubin Urine: NEGATIVE
Glucose, UA: NEGATIVE
Hyaline Cast: NONE SEEN /LPF
Ketones, ur: NEGATIVE
Nitrites, Initial: POSITIVE — AB
Specific Gravity, Urine: 1.02 (ref 1.001–1.035)
pH: 6 (ref 5.0–8.0)

## 2024-02-29 LAB — URINE CULTURE
MICRO NUMBER:: 17004829
SPECIMEN QUALITY:: ADEQUATE

## 2024-02-29 LAB — CULTURE INDICATED

## 2024-03-11 DIAGNOSIS — Z85828 Personal history of other malignant neoplasm of skin: Secondary | ICD-10-CM | POA: Diagnosis not present

## 2024-03-11 DIAGNOSIS — D0439 Carcinoma in situ of skin of other parts of face: Secondary | ICD-10-CM | POA: Diagnosis not present

## 2024-03-11 DIAGNOSIS — L820 Inflamed seborrheic keratosis: Secondary | ICD-10-CM | POA: Diagnosis not present

## 2024-03-25 ENCOUNTER — Encounter: Payer: Self-pay | Admitting: Obstetrics and Gynecology

## 2024-03-25 ENCOUNTER — Ambulatory Visit: Payer: Self-pay | Admitting: Obstetrics and Gynecology

## 2024-03-25 ENCOUNTER — Ambulatory Visit (INDEPENDENT_AMBULATORY_CARE_PROVIDER_SITE_OTHER): Admitting: Obstetrics and Gynecology

## 2024-03-25 VITALS — BP 122/78 | HR 73

## 2024-03-25 DIAGNOSIS — Z87440 Personal history of urinary (tract) infections: Secondary | ICD-10-CM

## 2024-03-25 DIAGNOSIS — R350 Frequency of micturition: Secondary | ICD-10-CM | POA: Diagnosis not present

## 2024-03-25 DIAGNOSIS — N3000 Acute cystitis without hematuria: Secondary | ICD-10-CM

## 2024-03-25 MED ORDER — CEFTRIAXONE SODIUM 1 G IJ SOLR
1.0000 g | Freq: Once | INTRAMUSCULAR | Status: AC
  Administered 2024-03-25: 1 g via INTRAMUSCULAR

## 2024-03-25 MED ORDER — ESTRADIOL 10 MCG VA TABS: 1.0000 | ORAL_TABLET | VAGINAL | 11 refills | Status: AC

## 2024-03-25 MED ORDER — IMVEXXY MAINTENANCE PACK 10 MCG VA INST: 1.0000 | VAGINAL_INSERT | VAGINAL | 12 refills | Status: AC

## 2024-03-25 MED ORDER — FLUCONAZOLE 150 MG PO TABS: 150.0000 mg | ORAL_TABLET | Freq: Once | ORAL | 0 refills | Status: AC

## 2024-03-25 NOTE — Progress Notes (Signed)
   Acute Office Visit  Subjective:    Patient ID: DALIYA PARCHMENT, female    DOB: 12-Sep-1950, 73 y.o.   MRN: [id number]   HPI 73 y.o. presents today for Office Visit (UTI - urinary frequency, uncomfortable with urination. No other symptoms. Started 03/20/24) .nocturia 3-4x at night Incomplete emptying Not using vaginal estrogen. Counseled on importance  No LMP recorded. Patient is postmenopausal.    Review of Systems     Objective:    OBGyn Exam  BP 122/78 (BP Location: Left Arm, Patient Position: Sitting)   Pulse 73   SpO2 98%  Wt Readings from Last 3 Encounters:  02/26/24 199 lb (90.3 kg)  04/11/23 188 lb (85.3 kg)  12/12/21 182 lb (82.6 kg)        UA with LE, WBC and bacteria  Assessment & Plan:  Rocephin IM given today. Reviewed allergies Diflucan  sent if needed for yeast infection after antibiotics UC pending. Discussed antibiotic may change based on this when it returns in a week or so To begin imvexxy .  If not covered by insurance, vagifem  was also sent to start twice weekly PV was sent in for atrophic vaginitis and bladder support. Samples of 4mcg given for 4 days use every day PV. RTC with any worsening or persistent s/s RTC for annual exams Referral to urgyn placed as well for consult  30 minutes spent on reviewing records, imaging,  and one on one patient time and counseling patient and documentation Dr. Glennon Almarie MARLA Glennon

## 2024-03-25 NOTE — Addendum Note (Signed)
 Addended by: Kristopher Attwood M on: 03/25/2024 11:26 AM   Modules accepted: Orders

## 2024-03-25 NOTE — Patient Instructions (Signed)
 Rocephin IM given today. Reviewed allergies Diflucan  sent if needed for yeast infection after antibiotics UC pending. Discussed antibiotic may change based on this when it returns in a week or so To begin imvexxy .  If not covered by insurance, vagifem  was also sent to start twice weekly PV was sent in for atrophic vaginitis and bladder support. Samples of 4mcg given for 4 days use every day PV. RTC with any worsening or persistent s/s RTC for annual exams Referral to urgyn placed as well for consult  UROGYN REFERRAL: Dr. Bernardine Gillis and Dr. Marilynne at [facility] office: [address], [phone]  The referral was placed but it may take a couple of weeks for the visit to be coordinated.

## 2024-03-27 LAB — URINALYSIS, COMPLETE W/RFL CULTURE
Bilirubin Urine: NEGATIVE
Glucose, UA: NEGATIVE
Hyaline Cast: NONE SEEN /LPF
Ketones, ur: NEGATIVE
Nitrites, Initial: NEGATIVE
Protein, ur: NEGATIVE
Specific Gravity, Urine: 1.005 (ref 1.001–1.035)
pH: 5.5 (ref 5.0–8.0)

## 2024-03-27 LAB — URINE CULTURE
MICRO NUMBER:: 17126876
SPECIMEN QUALITY:: ADEQUATE

## 2024-03-27 LAB — CULTURE INDICATED

## 2024-04-23 DIAGNOSIS — M1732 Unilateral post-traumatic osteoarthritis, left knee: Secondary | ICD-10-CM | POA: Diagnosis not present

## 2024-04-23 DIAGNOSIS — S82092A Other fracture of left patella, initial encounter for closed fracture: Secondary | ICD-10-CM | POA: Diagnosis not present

## 2024-05-09 ENCOUNTER — Encounter: Admitting: Nurse Practitioner
